# Patient Record
Sex: Female | Born: 1955
Health system: Southern US, Community
[De-identification: ages and names within clinical notes are randomized; demographics above are authoritative.]

## PROBLEM LIST (undated history)

## (undated) DIAGNOSIS — I10 Essential (primary) hypertension: Secondary | ICD-10-CM

## (undated) DIAGNOSIS — E785 Hyperlipidemia, unspecified: Secondary | ICD-10-CM

## (undated) DIAGNOSIS — B019 Varicella without complication: Secondary | ICD-10-CM

## (undated) DIAGNOSIS — IMO0002 Reserved for concepts with insufficient information to code with codable children: Secondary | ICD-10-CM

## (undated) HISTORY — DX: Hyperlipidemia, unspecified: E78.5

## (undated) HISTORY — DX: Essential (primary) hypertension: I10

## (undated) HISTORY — PX: BREAST EXCISIONAL BIOPSY: SUR124

## (undated) HISTORY — PX: ABDOMINAL HYSTERECTOMY: SHX81

## (undated) HISTORY — DX: Reserved for concepts with insufficient information to code with codable children: IMO0002

## (undated) HISTORY — DX: Varicella without complication: B01.9

## (undated) HISTORY — PX: BREAST BIOPSY: SHX20

---

## 2002-01-18 DIAGNOSIS — IMO0002 Reserved for concepts with insufficient information to code with codable children: Secondary | ICD-10-CM

## 2002-01-18 HISTORY — DX: Reserved for concepts with insufficient information to code with codable children: IMO0002

## 2004-03-27 ENCOUNTER — Ambulatory Visit: Payer: Self-pay | Admitting: Obstetrics and Gynecology

## 2004-03-31 ENCOUNTER — Ambulatory Visit: Payer: Self-pay | Admitting: Obstetrics and Gynecology

## 2004-10-01 ENCOUNTER — Ambulatory Visit: Payer: Self-pay | Admitting: Obstetrics and Gynecology

## 2005-03-29 ENCOUNTER — Ambulatory Visit: Payer: Self-pay | Admitting: Obstetrics and Gynecology

## 2006-06-10 ENCOUNTER — Ambulatory Visit: Payer: Self-pay

## 2007-07-19 ENCOUNTER — Ambulatory Visit: Payer: Self-pay | Admitting: Obstetrics and Gynecology

## 2007-07-26 ENCOUNTER — Ambulatory Visit: Payer: Self-pay

## 2007-07-31 ENCOUNTER — Ambulatory Visit: Payer: Self-pay | Admitting: Unknown Physician Specialty

## 2007-07-31 LAB — HM COLONOSCOPY

## 2007-08-02 ENCOUNTER — Ambulatory Visit: Payer: Self-pay

## 2007-08-29 ENCOUNTER — Ambulatory Visit: Payer: Self-pay | Admitting: Surgery

## 2007-09-27 ENCOUNTER — Ambulatory Visit: Payer: Self-pay | Admitting: Surgery

## 2007-10-04 ENCOUNTER — Ambulatory Visit: Payer: Self-pay | Admitting: Surgery

## 2007-11-02 ENCOUNTER — Ambulatory Visit: Payer: Self-pay | Admitting: Internal Medicine

## 2008-07-30 ENCOUNTER — Other Ambulatory Visit: Payer: Self-pay | Admitting: Internal Medicine

## 2008-08-07 ENCOUNTER — Ambulatory Visit: Payer: Self-pay

## 2008-08-21 ENCOUNTER — Ambulatory Visit: Payer: Self-pay

## 2008-10-31 ENCOUNTER — Other Ambulatory Visit: Payer: Self-pay | Admitting: Unknown Physician Specialty

## 2009-09-02 ENCOUNTER — Ambulatory Visit: Payer: Self-pay | Admitting: Internal Medicine

## 2010-02-17 ENCOUNTER — Ambulatory Visit: Payer: Self-pay | Admitting: Unknown Physician Specialty

## 2010-09-29 ENCOUNTER — Ambulatory Visit: Payer: Self-pay | Admitting: Internal Medicine

## 2010-12-14 ENCOUNTER — Ambulatory Visit: Payer: Self-pay | Admitting: Internal Medicine

## 2010-12-19 ENCOUNTER — Ambulatory Visit: Payer: Self-pay | Admitting: Internal Medicine

## 2011-09-30 ENCOUNTER — Ambulatory Visit: Payer: Self-pay | Admitting: Internal Medicine

## 2011-12-16 LAB — HM PAP SMEAR: HM Pap smear: NORMAL

## 2012-07-20 ENCOUNTER — Ambulatory Visit: Payer: Self-pay | Admitting: Internal Medicine

## 2012-07-22 LAB — BASIC METABOLIC PANEL: Potassium: 4.1 mmol/L (ref 3.4–5.3)

## 2012-07-25 ENCOUNTER — Encounter: Payer: Self-pay | Admitting: Internal Medicine

## 2012-07-25 ENCOUNTER — Ambulatory Visit (INDEPENDENT_AMBULATORY_CARE_PROVIDER_SITE_OTHER): Payer: 59 | Admitting: Internal Medicine

## 2012-07-25 VITALS — BP 122/82 | HR 64 | Temp 98.4°F | Resp 14 | Ht 66.0 in | Wt 165.2 lb

## 2012-07-25 DIAGNOSIS — I10 Essential (primary) hypertension: Secondary | ICD-10-CM

## 2012-07-25 DIAGNOSIS — E785 Hyperlipidemia, unspecified: Secondary | ICD-10-CM

## 2012-07-25 DIAGNOSIS — E663 Overweight: Secondary | ICD-10-CM | POA: Insufficient documentation

## 2012-07-25 DIAGNOSIS — E559 Vitamin D deficiency, unspecified: Secondary | ICD-10-CM

## 2012-07-25 DIAGNOSIS — Z6825 Body mass index (BMI) 25.0-25.9, adult: Secondary | ICD-10-CM

## 2012-07-25 DIAGNOSIS — R5381 Other malaise: Secondary | ICD-10-CM

## 2012-07-25 DIAGNOSIS — R5383 Other fatigue: Secondary | ICD-10-CM

## 2012-07-25 MED ORDER — LISINOPRIL 10 MG PO TABS: 10.0000 mg | ORAL_TABLET | Freq: Every day | ORAL | Status: DC

## 2012-07-25 NOTE — Patient Instructions (Addendum)
Please make an appt for fasting blood work  We will repeat your PAP 2 years after your last one.   This is  My version of a  "Low GI"  Diet:  It will  allow you to lose 4 to 8  lbs  per month without being hungry if you follow it carefully.  Your goal with exercise is a minimum of 30 minutes of aerobic exercise 5 days per week (Walking does not count once it becomes easy!)    All of the foods can be found at grocery stores and in bulk at [employer].  The Atkins protein bars and shakes are available in more varieties at Target, WalMart and Lowe's Foods.     7 AM Breakfast:  Choose from the following:  Low carbohydrate Protein  Shakes (I recommend the EAS AdvantEdge "Carb Control" shakes  Or the low carb shakes by Atkins.    2.5 carbs   Arnold's "Sandwhich Thin"toasted  w/ peanut butter (no jelly: about 20 net carbs  "Bagel Thin" with cream cheese and salmon: about 20 carbs   a scrambled egg/bacon/cheese burrito made with Mission's "carb balance" whole wheat tortilla  (about 10 net carbs )   Avoid cereal and bananas, oatmeal and cream of wheat and grits. They are loaded with carbohydrates!   10 AM: high protein snack  Protein bar by Atkins (the snack size, under 200 cal, usually < 6 net carbs).    A stick of cheese:  Around 1 carb,  100 cal     Dannon Light n Fit [place] Yogurt  (80 cal, 8 carbs)  Other so called "protein bars" and Greek yogurts tend to be loaded with carbohydrates.  Remember, in food advertising, the word "energy" is synonymous for " carbohydrate."  Lunch:   A Sandwich using the bread choices listed, Can use any  Eggs,  lunchmeat, grilled meat or canned tuna), avocado, regular mayo/mustard  and cheese.  A Salad using blue cheese, ranch,  Goddess or vinagrette,  No croutons or "confetti" and no "candied nuts" but regular nuts OK.   No pretzels or chips.  Pickles and miniature sweet peppers are a good low carb alternative that provide a "crunch"  The bread is the only source of  carbohydrate in a sandwich and  can be decreased by trying some of these alternatives to traditional loaf bread  Joseph's makes a pita bread and a flat bread that are 50 cal and 4 net carbs available at BJs and WalMart.  This can be toasted to use with hummous as well  Toufayan makes a low carb flatbread that's 100 cal and 9 net carbs available at [employer] and [employer] makes 2 sizes of  Low carb whole wheat tortilla  (The large one is 210 cal and 6 net carbs) Avoid "Low fat dressings, as well as Reyne Dumas and [address] dressings They are loaded with sugar!   3 PM/ Mid day  Snack:  Consider  1 ounce of  almonds, walnuts, pistachios, pecans, peanuts,  Macadamia nuts or a nut medley.  Avoid "granola"; the dried cranberries and raisins are loaded with carbohydrates. Mixed nuts as long as there are no raisins,  cranberries or dried fruit.     6 PM  Dinner:     Meat/fowl/fish with a green salad, and either broccoli, cauliflower, green beans, spinach, brussel sprouts or  Lima beans. DO NOT BREAD THE PROTEIN!!      There is a low carb  pasta by Dreamfield's that is acceptable and tastes great: only 5 digestible carbs/serving.( All grocery stores but BJs carry it )  Try Hurley Cisco Angelo's chicken piccata or chicken or eggplant parm over low carb pasta.(Lowes and BJs)   Marjory Lies Sanchez's "Carnitas" (pulled pork, no sauce,  0 carbs) or his beef pot roast to make a dinner burrito (at BJ's)  Pesto over low carb pasta (bj's sells a good quality pesto in the center refrigerated section of the deli   Whole wheat pasta is still full of digestible carbs and  Not as low in glycemic index as Dreamfield's.   Brown rice is still rice,  So skip the rice and noodles if you eat [place] or [place] (or at least limit to 1/2 cup)  9 PM snack :   Breyer's "low carb" fudgsicle or  ice cream bar (Carb Smart line), or  Weight Watcher's ice cream bar , or another "no sugar added" ice cream;  a serving of fresh  berries/cherries with whipped cream   Cheese or DANNON'S LlGHT N FIT GREEK YOGURT  Avoid bananas, pineapple, grapes  and watermelon on a regular basis because they are high in sugar.  THINK OF THEM AS DESSERT  Remember that snack Substitutions should be less than 10 NET carbs per serving and meals < 20 carbs. Remember to subtract fiber grams to get the "net carbs."

## 2012-07-25 NOTE — Progress Notes (Signed)
Patient ID: Karen Odom, female   DOB: 1955/06/09, 57 y.o.   MRN: [id number]  Patient Active Problem List   Diagnosis Date Noted  . Overweight (BMI 25.0-29.9) 07/25/2012  . Essential hypertension, benign 07/25/2012    Subjective:  CC:   Chief Complaint  Patient presents with  . Establish Care    HPI:   Karen Odom is a 57 y.o. female who presents as a new patient to establish primary care with the chief complaint of hypertension and overweight. . She is transferring from [employer].   Last PAP  By Arvil Chaco 10 months ago.  Despite a supracervical hysterectomy in  2003 due to large uterine fibroids and nonvisualization of ovaries. In the setting of menorrhagia,  Her follow up PAP was abnornmal,  HPV positive,  Had colposcopy and a Leep.  Has had 6 normal PAPS since then .  No FH of female CA.  No BRCA in family iether  Mammogram  Due , done at [facility]  3 biopsies all normal,  Last one late 50s.  Fibrocystic breast disease. Both breast.       Past Medical History  Diagnosis Date  . Chicken pox   . Hypertension   . Hyperlipidemia   . HPV test positive 2004    Past Surgical History  Procedure Laterality Date  . Breast biopsy Bilateral     2 x right breast 1 X left breast  . Abdominal hysterectomy      Family History  Problem Relation Age of Onset  . Alcohol abuse Father   . Heart disease Father   . Hyperlipidemia Father   . Hypertension Father   . Early death Father   . Diabetes Father     History   Social History  . Marital Status: Divorced    Spouse Name: N/A    Number of Children: N/A  . Years of Education: N/A   Occupational History  . Not on file.   Social History Main Topics  . Smoking status: Former Smoker -- 1.00 packs/day    Types: Cigarettes    Quit date: 07/26/1991  . Smokeless tobacco: Never Used  . Alcohol Use: Yes  . Drug Use: No  . Sexually Active: No   Other Topics Concern  . Not on file   Social History Narrative  . No  narrative on file   No Known Allergies   Review of Systems:  Patient denies headache, fevers, malaise, unintentional weight loss, skin rash, eye pain, sinus congestion and sinus pain, sore throat, dysphagia,  hemoptysis , cough, dyspnea, wheezing, chest pain, palpitations, orthopnea, edema, abdominal pain, nausea, melena, diarrhea, constipation, flank pain, dysuria, hematuria, urinary  Frequency, nocturia, numbness, tingling, seizures,  Focal weakness, Loss of consciousness,  Tremor, insomnia, depression, anxiety, and suicidal ideation.     Objective:  BP 122/82  Pulse 64  Temp(Src) 98.4 F (36.9 C) (Oral)  Resp 14  Ht 5\' 6"  (1.676 m)  Wt 165 lb 4 oz (74.957 kg)  BMI 26.68 kg/m2  SpO2 98%  General appearance: alert, cooperative and appears stated age Ears: normal TM's and external ear canals both ears Throat: lips, mucosa, and tongue normal; teeth and gums normal Neck: no adenopathy, no carotid bruit, supple, symmetrical, trachea midline and thyroid not enlarged, symmetric, no tenderness/mass/nodules Back: symmetric, no curvature. ROM normal. No CVA tenderness. Lungs: clear to auscultation bilaterally Heart: regular rate and rhythm, S1, S2 normal, no murmur, click, rub or gallop Abdomen: soft, non-tender; bowel sounds normal; no  masses,  no organomegaly Pulses: 2+ and symmetric Skin: Skin color, texture, turgor normal. No rashes or lesions Lymph nodes: Cervical, supraclavicular, and axillary nodes normal.  Assessment and Plan:  Overweight (BMI 25.0-29.9) I have addressed  BMI and recommended a low glycemic index diet utilizing smaller more frequent meals to increase metabolism.  I have also recommended that patient start exercising with a goal of 30 minutes of aerobic exercise a minimum of 5 days per week. Screening for lipid disorders, thyroid and diabetes to be done today.    Essential hypertension, benign Well controlled on current regimen. Renal function stable, no  changes today.   Updated Medication List Outpatient Encounter Prescriptions as of 07/25/2012  Medication Sig Dispense Refill  . lisinopril (PRINIVIL,ZESTRIL) 10 MG tablet Take 1 tablet (10 mg total) by mouth daily.  90 tablet  3  . [DISCONTINUED] lisinopril (PRINIVIL,ZESTRIL) 10 MG tablet Take 10 mg by mouth daily.       No facility-administered encounter medications on file as of 07/25/2012.     Orders Placed This Encounter  Procedures  . CBC with Differential  . Comprehensive metabolic panel  . TSH  . Lipid panel  . Vitamin D 25 hydroxy    No Follow-up on file.

## 2012-07-26 NOTE — Assessment & Plan Note (Signed)
Well controlled on current regimen. Renal function stable, no changes today. 

## 2012-07-26 NOTE — Assessment & Plan Note (Signed)
I have addressed  BMI and recommended a low glycemic index diet utilizing smaller more frequent meals to increase metabolism.  I have also recommended that patient start exercising with a goal of 30 minutes of aerobic exercise a minimum of 5 days per week. Screening for lipid disorders, thyroid and diabetes to be done today.   

## 2012-08-23 ENCOUNTER — Other Ambulatory Visit (INDEPENDENT_AMBULATORY_CARE_PROVIDER_SITE_OTHER): Payer: 59

## 2012-08-23 ENCOUNTER — Telehealth: Payer: Self-pay | Admitting: *Deleted

## 2012-08-23 DIAGNOSIS — E785 Hyperlipidemia, unspecified: Secondary | ICD-10-CM

## 2012-08-23 DIAGNOSIS — R5383 Other fatigue: Secondary | ICD-10-CM

## 2012-08-23 DIAGNOSIS — E559 Vitamin D deficiency, unspecified: Secondary | ICD-10-CM

## 2012-08-23 DIAGNOSIS — R5381 Other malaise: Secondary | ICD-10-CM

## 2012-08-23 DIAGNOSIS — R748 Abnormal levels of other serum enzymes: Secondary | ICD-10-CM

## 2012-08-23 LAB — CBC WITH DIFFERENTIAL/PLATELET
Basophils Relative: 0.5 % (ref 0.0–3.0)
Eosinophils Relative: 3.4 % (ref 0.0–5.0)
HCT: 41.5 % (ref 36.0–46.0)
Hemoglobin: 13.9 g/dL (ref 12.0–15.0)
Lymphs Abs: 1.8 10*3/uL (ref 0.7–4.0)
MCV: 96.2 fl (ref 78.0–100.0)
Monocytes Absolute: 0.4 10*3/uL (ref 0.1–1.0)
RBC: 4.31 Mil/uL (ref 3.87–5.11)
WBC: 4 10*3/uL — ABNORMAL LOW (ref 4.5–10.5)

## 2012-08-23 LAB — COMPREHENSIVE METABOLIC PANEL
Albumin: 4.1 g/dL (ref 3.5–5.2)
Alkaline Phosphatase: 46 U/L (ref 39–117)
BUN: 13 mg/dL (ref 6–23)
CO2: 31 mEq/L (ref 19–32)
GFR: 81.11 mL/min (ref >60.00)
Glucose, Bld: 100 mg/dL — ABNORMAL HIGH (ref 70–99)
Potassium: 5.1 mEq/L (ref 3.5–5.1)
Total Bilirubin: 0.3 mg/dL (ref 0.3–1.2)

## 2012-08-23 LAB — LIPID PANEL
Cholesterol: 226 mg/dL — ABNORMAL HIGH (ref 0–200)
Total CHOL/HDL Ratio: 2
Triglycerides: 67 mg/dL (ref 0.0–149.0)

## 2012-08-23 NOTE — Telephone Encounter (Signed)
Pt came in for labs today, request if a TSH was in her labs today to please not get them she just had one done on 07.05.2014, pt gave me results and i gave them to [place]  Just an [employer]

## 2012-08-24 ENCOUNTER — Other Ambulatory Visit: Payer: Self-pay | Admitting: Internal Medicine

## 2012-08-24 ENCOUNTER — Telehealth: Payer: Self-pay | Admitting: Internal Medicine

## 2012-08-24 NOTE — Progress Notes (Signed)
Quick Note:  Your liver enzyme is mildly elevated for unclear reasons. I recommend that you return In a month for repeat hepatic panel and if still elevated we'll do additional blood tests to rule out various infections and autoimmune disorders that can elevate liver enzymes (hepatitis, hemochromatosis, etc) and have an ultrasound of the liver to examine its size and general appearance.  ______

## 2012-08-24 NOTE — Telephone Encounter (Signed)
Left voicemail for patient to return call.

## 2012-08-24 NOTE — Addendum Note (Signed)
Addended by: Sherlene Shams on: 08/24/2012 11:36 PM   Modules accepted: Orders

## 2012-08-24 NOTE — Telephone Encounter (Signed)
The abbreviated labs fron [facility] are normal and are fine for monitoring use of lisinopril she should have fasting labs done here prior to next 6 month follow up

## 2012-09-08 NOTE — Telephone Encounter (Signed)
Patient notified and has lab appointment.

## 2012-10-04 ENCOUNTER — Other Ambulatory Visit (INDEPENDENT_AMBULATORY_CARE_PROVIDER_SITE_OTHER): Payer: 59

## 2012-10-04 ENCOUNTER — Encounter: Payer: Self-pay | Admitting: Internal Medicine

## 2012-10-04 ENCOUNTER — Ambulatory Visit: Payer: Self-pay | Admitting: Internal Medicine

## 2012-10-04 DIAGNOSIS — R748 Abnormal levels of other serum enzymes: Secondary | ICD-10-CM

## 2012-10-04 LAB — HM MAMMOGRAPHY: HM Mammogram: NORMAL

## 2012-10-04 LAB — HEPATIC FUNCTION PANEL
ALT: 21 U/L (ref 0–35)
Albumin: 4.1 g/dL (ref 3.5–5.2)
Bilirubin, Direct: 0.1 mg/dL (ref 0.0–0.3)
Total Protein: 6.8 g/dL (ref 6.0–8.3)

## 2012-10-05 ENCOUNTER — Encounter: Payer: Self-pay | Admitting: Internal Medicine

## 2012-10-20 ENCOUNTER — Encounter: Payer: Self-pay | Admitting: Internal Medicine

## 2012-11-14 ENCOUNTER — Encounter: Payer: Self-pay | Admitting: Internal Medicine

## 2012-11-15 NOTE — Telephone Encounter (Signed)
Amber please initiate referral to [facility] for this patient.  There is no EPIc order available Reason:  Hypertension ,  Overweight

## 2013-01-02 ENCOUNTER — Ambulatory Visit: Payer: Self-pay | Admitting: Internal Medicine

## 2013-01-18 ENCOUNTER — Ambulatory Visit: Payer: Self-pay | Admitting: Internal Medicine

## 2013-02-07 ENCOUNTER — Encounter: Payer: Self-pay | Admitting: Internal Medicine

## 2013-02-07 DIAGNOSIS — M8580 Other specified disorders of bone density and structure, unspecified site: Secondary | ICD-10-CM

## 2013-02-07 NOTE — Telephone Encounter (Signed)
Amber per [employer] request,  Send rx to [place],  Patient will set up ,  thanks

## 2013-02-27 ENCOUNTER — Ambulatory Visit: Payer: Self-pay | Admitting: Internal Medicine

## 2013-03-09 ENCOUNTER — Ambulatory Visit: Payer: Self-pay | Admitting: Internal Medicine

## 2013-03-12 ENCOUNTER — Telehealth: Payer: Self-pay | Admitting: Internal Medicine

## 2013-03-12 DIAGNOSIS — M8580 Other specified disorders of bone density and structure, unspecified site: Secondary | ICD-10-CM | POA: Insufficient documentation

## 2013-03-12 DIAGNOSIS — M810 Age-related osteoporosis without current pathological fracture: Secondary | ICD-10-CM | POA: Insufficient documentation

## 2013-03-12 NOTE — Telephone Encounter (Signed)
Left detailed message on home voicemail per DPR.

## 2013-03-12 NOTE — Assessment & Plan Note (Signed)
Her T scores have progressively worsened by repeat DEXA.  We will discuss therapy at her annual visit.  Needs to be getting 1000 units Vit d and 1200 mg calcium  Daily through diet and supplements and doing weight bearing exeercise (walking) daily for 30 minutes   

## 2013-03-12 NOTE — Telephone Encounter (Signed)
Her T scores have progressively worsened by repeat DEXA.  We will discuss therapy at her annual visit.  Needs to be getting 1000 units Vit d and 1200 mg calcium  Daily through diet and supplements and doing weight bearing exeercise (walking) daily for 30 minutes

## 2013-03-18 ENCOUNTER — Ambulatory Visit: Payer: Self-pay | Admitting: Internal Medicine

## 2013-04-04 ENCOUNTER — Encounter: Payer: Self-pay | Admitting: Internal Medicine

## 2013-04-15 ENCOUNTER — Encounter: Payer: Self-pay | Admitting: Internal Medicine

## 2013-04-16 MED ORDER — CALCIUM CARBONATE-VITAMIN D 500-200 MG-UNIT PO TABS: 1.0000 | ORAL_TABLET | Freq: Two times a day (BID) | ORAL | Status: DC

## 2013-04-16 NOTE — Telephone Encounter (Signed)
Karen Odom Please  MAIL RX FOR CALCIUM TO PATIENT

## 2013-04-18 NOTE — Telephone Encounter (Signed)
Karen Odom  \ Can you print out the letter I wrote for her insurance to pay for the calcium/vitamin D supplementation?  ThanksT

## 2013-05-17 ENCOUNTER — Telehealth: Payer: Self-pay | Admitting: Internal Medicine

## 2013-05-17 NOTE — Telephone Encounter (Signed)
Pr dropped off stanely benefit forms to be filled out In box

## 2013-05-17 NOTE — Telephone Encounter (Signed)
Placed in Dr. Lowella Dell folder for 05/18/13

## 2013-05-18 DIAGNOSIS — Z0279 Encounter for issue of other medical certificate: Secondary | ICD-10-CM

## 2013-08-14 ENCOUNTER — Encounter: Payer: 59 | Admitting: Internal Medicine

## 2013-08-16 ENCOUNTER — Other Ambulatory Visit (HOSPITAL_COMMUNITY)
Admission: RE | Admit: 2013-08-16 | Discharge: 2013-08-16 | Disposition: A | Discharge status: Home / Self Care | Payer: 59 | Source: Ambulatory Visit | Attending: Internal Medicine | Admitting: Internal Medicine

## 2013-08-16 ENCOUNTER — Encounter: Payer: Self-pay | Admitting: Internal Medicine

## 2013-08-16 ENCOUNTER — Ambulatory Visit (INDEPENDENT_AMBULATORY_CARE_PROVIDER_SITE_OTHER): Payer: 59 | Admitting: Internal Medicine

## 2013-08-16 VITALS — BP 128/78 | HR 59 | Temp 98.3°F | Resp 18 | Ht 66.25 in | Wt 135.5 lb

## 2013-08-16 DIAGNOSIS — E663 Overweight: Secondary | ICD-10-CM

## 2013-08-16 DIAGNOSIS — Z1239 Encounter for other screening for malignant neoplasm of breast: Secondary | ICD-10-CM

## 2013-08-16 DIAGNOSIS — M8580 Other specified disorders of bone density and structure, unspecified site: Secondary | ICD-10-CM

## 2013-08-16 DIAGNOSIS — R5383 Other fatigue: Secondary | ICD-10-CM

## 2013-08-16 DIAGNOSIS — M899 Disorder of bone, unspecified: Secondary | ICD-10-CM

## 2013-08-16 DIAGNOSIS — Z1151 Encounter for screening for human papillomavirus (HPV): Secondary | ICD-10-CM | POA: Insufficient documentation

## 2013-08-16 DIAGNOSIS — R5381 Other malaise: Secondary | ICD-10-CM

## 2013-08-16 DIAGNOSIS — Z0000 Encounter for general adult medical examination without abnormal findings: Secondary | ICD-10-CM

## 2013-08-16 DIAGNOSIS — M949 Disorder of cartilage, unspecified: Secondary | ICD-10-CM

## 2013-08-16 DIAGNOSIS — Z01419 Encounter for gynecological examination (general) (routine) without abnormal findings: Secondary | ICD-10-CM | POA: Insufficient documentation

## 2013-08-16 DIAGNOSIS — I10 Essential (primary) hypertension: Secondary | ICD-10-CM

## 2013-08-16 DIAGNOSIS — Z124 Encounter for screening for malignant neoplasm of cervix: Secondary | ICD-10-CM

## 2013-08-16 DIAGNOSIS — E785 Hyperlipidemia, unspecified: Secondary | ICD-10-CM

## 2013-08-16 LAB — COMPREHENSIVE METABOLIC PANEL
ALT: 24 U/L (ref 0–35)
AST: 29 U/L (ref 0–37)
Albumin: 4.4 g/dL (ref 3.5–5.2)
Alkaline Phosphatase: 44 U/L (ref 39–117)
BUN: 17 mg/dL (ref 6–23)
CALCIUM: 9.7 mg/dL (ref 8.4–10.5)
CHLORIDE: 101 meq/L (ref 96–112)
CO2: 26 mEq/L (ref 19–32)
Creatinine, Ser: 0.8 mg/dL (ref 0.4–1.2)
GFR: 83.29 mL/min (ref >60.00)
Glucose, Bld: 87 mg/dL (ref 70–99)
Potassium: 4.6 mEq/L (ref 3.5–5.1)
Sodium: 137 mEq/L (ref 135–145)
Total Bilirubin: 0.8 mg/dL (ref 0.2–1.2)
Total Protein: 6.9 g/dL (ref 6.0–8.3)

## 2013-08-16 LAB — LIPID PANEL
CHOL/HDL RATIO: 2
Cholesterol: 250 mg/dL — ABNORMAL HIGH (ref 0–200)
HDL: 125.8 mg/dL (ref >39.00)
LDL CALC: 118 mg/dL — AB (ref 0–99)
NONHDL: 124.2
Triglycerides: 31 mg/dL (ref 0.0–149.0)
VLDL: 6.2 mg/dL (ref 0.0–40.0)

## 2013-08-16 LAB — TSH: TSH: 1.69 u[IU]/mL (ref 0.35–4.50)

## 2013-08-16 LAB — CBC WITH DIFFERENTIAL/PLATELET
Basophils Absolute: 0 10*3/uL (ref 0.0–0.1)
Basophils Relative: 0.7 % (ref 0.0–3.0)
EOS PCT: 4.5 % (ref 0.0–5.0)
Eosinophils Absolute: 0.2 10*3/uL (ref 0.0–0.7)
HEMATOCRIT: 41.2 % (ref 36.0–46.0)
Hemoglobin: 13.7 g/dL (ref 12.0–15.0)
LYMPHS ABS: 2 10*3/uL (ref 0.7–4.0)
Lymphocytes Relative: 41.3 % (ref 12.0–46.0)
MCHC: 33.2 g/dL (ref 30.0–36.0)
MCV: 97.3 fl (ref 78.0–100.0)
MONO ABS: 0.6 10*3/uL (ref 0.1–1.0)
Monocytes Relative: 12.9 % — ABNORMAL HIGH (ref 3.0–12.0)
NEUTROS ABS: 2 10*3/uL (ref 1.4–7.7)
Neutrophils Relative %: 40.6 % — ABNORMAL LOW (ref 43.0–77.0)
PLATELETS: 417 10*3/uL — AB (ref 150.0–400.0)
RBC: 4.24 Mil/uL (ref 3.87–5.11)
RDW: 13.7 % (ref 11.5–15.5)
WBC: 4.9 10*3/uL (ref 4.0–10.5)

## 2013-08-16 LAB — VITAMIN D 25 HYDROXY (VIT D DEFICIENCY, FRACTURES): VITD: 64.82 ng/mL (ref 30.00–100.00)

## 2013-08-16 NOTE — Progress Notes (Signed)
Patient ID: Karen Odom, female   DOB: 1955/03/05, 58 y.o.   MRN: [id number]     Subjective:     Karen Odom is a 58 y.o. female and is here for a comprehensive physical exam. The patient reports no problems.  History   Social History  . Marital Status: Divorced    Spouse Name: N/A    Number of Children: N/A  . Years of Education: N/A   Occupational History  . Not on file.   Social History Main Topics  . Smoking status: Former Smoker -- 1.00 packs/day    Types: Cigarettes    Quit date: 07/26/1991  . Smokeless tobacco: Never Used  . Alcohol Use: Yes  . Drug Use: No  . Sexual Activity: No   Other Topics Concern  . Not on file   Social History Narrative  . No narrative on file   Health Maintenance  Topic Date Due  . Influenza Vaccine  08/18/2013  . Mammogram  10/05/2014  . Tetanus/tdap  05/06/2015  . Pap Smear  08/16/2016  . Colonoscopy  07/30/2017    The following portions of the patient's history were reviewed and updated as appropriate: allergies, current medications, past family history, past medical history, past social history, past surgical history and problem list.  Review of Systems A comprehensive review of systems was negative.   Objective:   BP 128/78  Pulse 59  Temp(Src) 98.3 F (36.8 C) (Oral)  Resp 18  Ht 5' 6.25" (1.683 m)  Wt 135 lb 8 oz (61.462 kg)  BMI 21.70 kg/m2  SpO2 99%  General Appearance:    Alert, cooperative, no distress, appears stated age  Head:    Normocephalic, without obvious abnormality, atraumatic  Eyes:    PERRL, conjunctiva/corneas clear, EOM's intact, fundi    benign, both eyes  Ears:    Normal TM's and external ear canals, both ears  Nose:   Nares normal, septum midline, mucosa normal, no drainage    or sinus tenderness  Throat:   Lips, mucosa, and tongue normal; teeth and gums normal  Neck:   Supple, symmetrical, trachea midline, no adenopathy;    thyroid:  no enlargement/tenderness/nodules; no carotid  bruit or JVD  Back:     Symmetric, no curvature, ROM normal, no CVA tenderness  Lungs:     Clear to auscultation bilaterally, respirations unlabored  Chest Wall:    No tenderness or deformity   Heart:    Regular rate and rhythm, S1 and S2 normal, no murmur, rub   or gallop  Breast Exam:    No tenderness, masses, or nipple abnormality  Abdomen:     Soft, non-tender, bowel sounds active all four quadrants,    no masses, no organomegaly  Genitalia:    Pelvic: cervix normal in appearance, external genitalia normal, no adnexal masses or tenderness, no cervical motion tenderness, rectovaginal septum normal, uterus normal size, shape, and consistency and vagina normal without discharge  Extremities:   Extremities normal, atraumatic, no cyanosis or edema  Pulses:   2+ and symmetric all extremities  Skin:   Skin color, texture, turgor normal, no rashes or lesions  Lymph nodes:   Cervical, supraclavicular, and axillary nodes normal  Neurologic:   CNII-XII intact, normal strength, sensation and reflexes    throughout     .    Assessment and Plan:   Overweight (BMI 25.0-29.9) I have congratulated her in reduction of   BMI to 21 with the loss of 30 lbs  and encouraged  Continued low glycemic index diet and regular exercise a minimum of 5 days per week. To maintain weight.  I  Screening for cervical cancer She is S/p supacervical hysterectomy.  PAP done today due to history of abnormal PAP with HPV positive s/o LEEP  ;  Exam normal   Visit for preventive health examination Annual comprehensive exam was done including breast, pelvic and PAP smear. All screenings have been addressed .    Updated Medication List Outpatient Encounter Prescriptions as of 08/16/2013  Medication Sig  . calcium-vitamin D (OSCAL WITH D) 500-200 MG-UNIT per tablet Take 1 tablet by mouth 2 (two) times daily.  Marland Kitchen lisinopril (PRINIVIL,ZESTRIL) 10 MG tablet Take 1 tablet (10 mg total) by mouth daily.

## 2013-08-16 NOTE — Assessment & Plan Note (Addendum)
I have congratulated her in reduction of   BMI to 21 with the loss of 30 lbs and encouraged  Continued low glycemic index diet and regular exercise a minimum of 5 days per week. To maintain weight.  I

## 2013-08-16 NOTE — Progress Notes (Signed)
Pre-visit discussion using our clinic review tool. No additional management support is needed unless otherwise documented below in the visit note.  

## 2013-08-16 NOTE — Patient Instructions (Signed)
You look amazing!   I recommend getting the majority of your calcium and Vitamin D  through diet rather than supplements given the recent association of calcium supplements with increased coronary artery calcium scores (You need 1200 mg daily )   Unsweetened almond/coconut milk is a great low calorie low carb, cholesterol free  way to increase your dietary calcium and vitamin D.  Try the blue Jackquline Bosch  Try the WASA crackers and the Quest bars as convenient low snacks  Try toasting the Joseph's pita bread

## 2013-08-17 ENCOUNTER — Encounter: Payer: Self-pay | Admitting: Internal Medicine

## 2013-08-17 LAB — CYTOLOGY - PAP

## 2013-08-19 ENCOUNTER — Encounter: Payer: Self-pay | Admitting: Internal Medicine

## 2013-08-19 DIAGNOSIS — Z0000 Encounter for general adult medical examination without abnormal findings: Secondary | ICD-10-CM | POA: Insufficient documentation

## 2013-08-19 DIAGNOSIS — Z124 Encounter for screening for malignant neoplasm of cervix: Secondary | ICD-10-CM | POA: Insufficient documentation

## 2013-08-19 NOTE — Assessment & Plan Note (Signed)
She is S/p supacervical hysterectomy.  PAP done today due to history of abnormal PAP with HPV positive s/o LEEP  ;  Exam normal

## 2013-08-19 NOTE — Assessment & Plan Note (Signed)
Annual comprehensive exam was done including breast, pelvic and PAP smear. All screenings have been addressed .  

## 2013-10-29 ENCOUNTER — Encounter: Payer: Self-pay | Admitting: Internal Medicine

## 2013-10-31 MED ORDER — LISINOPRIL 10 MG PO TABS: 10.0000 mg | ORAL_TABLET | Freq: Every day | ORAL | Status: DC

## 2013-11-28 ENCOUNTER — Ambulatory Visit: Payer: Self-pay | Admitting: Internal Medicine

## 2013-11-28 ENCOUNTER — Encounter: Payer: Self-pay | Admitting: *Deleted

## 2013-11-28 LAB — HM MAMMOGRAPHY: HM MAMMO: NEGATIVE

## 2013-12-18 ENCOUNTER — Encounter: Payer: Self-pay | Admitting: Internal Medicine

## 2014-01-24 ENCOUNTER — Encounter: Payer: Self-pay | Admitting: *Deleted

## 2014-01-24 NOTE — Telephone Encounter (Signed)
From: Alveria Apley Sent: 10/08/2013 5:58 AM EDT To: Aviva Kluver Subject: RE:Flu Shot Clinic Saturday October 13, 2013  I have already received FLU SHOT as an employee of [facility].  Thank You, Norlene Campbell  ----- Message ----- From: Genia Plants Sent: 10/06/2013 6:37 PM EDT To: Alveria Apley Subject: Flu Shot Clinic Saturday October 13, 2013  [facility] will hold a [facility] on Saturday, September 26 from 9 am to noon. In order to plan appropriately, we ask you to please call the office to schedule an appointment time during the clinic. Our schedulers are ready to assist you, Monday through Friday, 8 am to 5 pm at [phone]. Thank you for choosing [facility] for your healthcare needs.

## 2014-03-12 ENCOUNTER — Encounter: Payer: Self-pay | Admitting: Internal Medicine

## 2014-03-12 DIAGNOSIS — Z13820 Encounter for screening for osteoporosis: Secondary | ICD-10-CM

## 2014-03-12 NOTE — Telephone Encounter (Signed)
Please advise 

## 2014-04-09 ENCOUNTER — Ambulatory Visit: Payer: Self-pay | Admitting: Internal Medicine

## 2014-04-16 ENCOUNTER — Telehealth: Payer: Self-pay | Admitting: Internal Medicine

## 2014-04-16 ENCOUNTER — Encounter: Payer: Self-pay | Admitting: *Deleted

## 2014-04-16 DIAGNOSIS — M8580 Other specified disorders of bone density and structure, unspecified site: Secondary | ICD-10-CM

## 2014-04-16 NOTE — Telephone Encounter (Signed)
Letter mailed

## 2014-04-16 NOTE — Telephone Encounter (Signed)
You have moderate osteopenia by recent DEXA , Your , T Score was  -1.9 , so  Your  risk of fracture in the next  10 yrs is about 8% .  I recommend weight bearing exercise, 1200 mg calcium through diet /supplements and 1000 units Vit D daily  And a Repeat DEXA in 2 years .  I recommend getting the majority of your calcium and Vitamin D  through diet rather than supplements given the recent association of calcium supplements with increased coronary artery calcium scores (You need 1200 mg daily )    almond milk is a great low calorie low carb, cholesterol free  way to increase your dietary calcium and vitamin D.  Try the Silk brand

## 2014-08-22 ENCOUNTER — Ambulatory Visit (INDEPENDENT_AMBULATORY_CARE_PROVIDER_SITE_OTHER): Payer: 59 | Admitting: Internal Medicine

## 2014-08-22 ENCOUNTER — Encounter: Payer: Self-pay | Admitting: Internal Medicine

## 2014-08-22 VITALS — BP 124/78 | HR 61 | Temp 98.5°F | Resp 14 | Ht 66.75 in | Wt 128.0 lb

## 2014-08-22 DIAGNOSIS — Z0000 Encounter for general adult medical examination without abnormal findings: Secondary | ICD-10-CM | POA: Diagnosis not present

## 2014-08-22 DIAGNOSIS — E875 Hyperkalemia: Secondary | ICD-10-CM

## 2014-08-22 DIAGNOSIS — E785 Hyperlipidemia, unspecified: Secondary | ICD-10-CM | POA: Diagnosis not present

## 2014-08-22 DIAGNOSIS — R5383 Other fatigue: Secondary | ICD-10-CM

## 2014-08-22 DIAGNOSIS — R634 Abnormal weight loss: Secondary | ICD-10-CM

## 2014-08-22 DIAGNOSIS — I10 Essential (primary) hypertension: Secondary | ICD-10-CM | POA: Diagnosis not present

## 2014-08-22 DIAGNOSIS — Z1239 Encounter for other screening for malignant neoplasm of breast: Secondary | ICD-10-CM

## 2014-08-22 DIAGNOSIS — M8580 Other specified disorders of bone density and structure, unspecified site: Secondary | ICD-10-CM

## 2014-08-22 DIAGNOSIS — Z113 Encounter for screening for infections with a predominantly sexual mode of transmission: Secondary | ICD-10-CM | POA: Diagnosis not present

## 2014-08-22 LAB — CBC WITH DIFFERENTIAL/PLATELET
BASOS PCT: 0.8 % (ref 0.0–3.0)
Basophils Absolute: 0 10*3/uL (ref 0.0–0.1)
EOS ABS: 0.2 10*3/uL (ref 0.0–0.7)
Eosinophils Relative: 3.1 % (ref 0.0–5.0)
HCT: 41.3 % (ref 36.0–46.0)
HEMOGLOBIN: 13.8 g/dL (ref 12.0–15.0)
Lymphocytes Relative: 26.3 % (ref 12.0–46.0)
Lymphs Abs: 1.6 10*3/uL (ref 0.7–4.0)
MCHC: 33.5 g/dL (ref 30.0–36.0)
MCV: 99.7 fl (ref 78.0–100.0)
Monocytes Absolute: 0.6 10*3/uL (ref 0.1–1.0)
Monocytes Relative: 9.5 % (ref 3.0–12.0)
NEUTROS ABS: 3.6 10*3/uL (ref 1.4–7.7)
Neutrophils Relative %: 60.3 % (ref 43.0–77.0)
Platelets: 408 10*3/uL — ABNORMAL HIGH (ref 150.0–400.0)
RBC: 4.14 Mil/uL (ref 3.87–5.11)
RDW: 14.4 % (ref 11.5–15.5)
WBC: 5.9 10*3/uL (ref 4.0–10.5)

## 2014-08-22 LAB — TSH: TSH: 1.17 u[IU]/mL (ref 0.35–4.50)

## 2014-08-22 LAB — LIPID PANEL
CHOLESTEROL: 260 mg/dL — AB (ref 0–200)
HDL: 118.4 mg/dL (ref >39.00)
LDL Cholesterol: 130 mg/dL — ABNORMAL HIGH (ref 0–99)
NonHDL: 141.33
Total CHOL/HDL Ratio: 2
Triglycerides: 59 mg/dL (ref 0.0–149.0)
VLDL: 11.8 mg/dL (ref 0.0–40.0)

## 2014-08-22 LAB — COMPREHENSIVE METABOLIC PANEL
ALT: 19 U/L (ref 0–35)
AST: 21 U/L (ref 0–37)
Albumin: 4.4 g/dL (ref 3.5–5.2)
Alkaline Phosphatase: 39 U/L (ref 39–117)
BILIRUBIN TOTAL: 0.4 mg/dL (ref 0.2–1.2)
BUN: 22 mg/dL (ref 6–23)
CALCIUM: 9.9 mg/dL (ref 8.4–10.5)
CO2: 30 mEq/L (ref 19–32)
Chloride: 105 mEq/L (ref 96–112)
Creatinine, Ser: 0.78 mg/dL (ref 0.40–1.20)
GFR: 80.54 mL/min (ref >60.00)
GLUCOSE: 93 mg/dL (ref 70–99)
POTASSIUM: 5.6 meq/L — AB (ref 3.5–5.1)
SODIUM: 141 meq/L (ref 135–145)
Total Protein: 6.7 g/dL (ref 6.0–8.3)

## 2014-08-22 NOTE — Patient Instructions (Signed)
Mammogram due in November We will repeat your DEXA and PAP in 2017  Add upper body weight to your regular exercise to help strengthen your spine  Health Maintenance Adopting a healthy lifestyle and getting preventive care can go a long way to promote health and wellness. Talk with your health care provider about what schedule of regular examinations is right for you. This is a good chance for you to check in with your provider about disease prevention and staying healthy. In between checkups, there are plenty of things you can do on your own. Experts have done a lot of research about which lifestyle changes and preventive measures are most likely to keep you healthy. Ask your health care provider for more information. WEIGHT AND DIET  Eat a healthy diet  Be sure to include plenty of vegetables, fruits, low-fat dairy products, and lean protein.  Do not eat a lot of foods high in solid fats, added sugars, or salt.  Get regular exercise. This is one of the most important things you can do for your health.  Most adults should exercise for at least 150 minutes each week. The exercise should increase your heart rate and make you sweat (moderate-intensity exercise).  Most adults should also do strengthening exercises at least twice a week. This is in addition to the moderate-intensity exercise.  Maintain a healthy weight  Body mass index (BMI) is a measurement that can be used to identify possible weight problems. It estimates body fat based on height and weight. Your health care provider can help determine your BMI and help you achieve or maintain a healthy weight.  For females 58 years of age and older:   A BMI below 18.5 is considered underweight.  A BMI of 18.5 to 24.9 is normal.  A BMI of 25 to 29.9 is considered overweight.  A BMI of 30 and above is considered obese.  Watch levels of cholesterol and blood lipids  You should start having your blood tested for lipids and  cholesterol at 58 years of age, then have this test every 5 years.  You may need to have your cholesterol levels checked more often if:  Your lipid or cholesterol levels are high.  You are older than 58 years of age.  You are at high risk for heart disease.  CANCER SCREENING   Lung Cancer  Lung cancer screening is recommended for adults 41-23 years old who are at high risk for lung cancer because of a history of smoking.  A yearly low-dose CT scan of the lungs is recommended for people who:  Currently smoke.  Have quit within the past 15 years.  Have at least a 30-pack-year history of smoking. A pack year is smoking an average of one pack of cigarettes a day for 1 year.  Yearly screening should continue until it has been 15 years since you quit.  Yearly screening should stop if you develop a health problem that would prevent you from having lung cancer treatment.  Breast Cancer  Practice breast self-awareness. This means understanding how your breasts normally appear and feel.  It also means doing regular breast self-exams. Let your health care provider know about any changes, no matter how small.  If you are in your 20s or 30s, you should have a clinical breast exam (CBE) by a health care provider every 1-3 years as part of a regular health exam.  If you are 45 or older, have a CBE every year. Also consider having a  breast X-ray (mammogram) every year.  If you have a family history of breast cancer, talk to your health care provider about genetic screening.  If you are at high risk for breast cancer, talk to your health care provider about having an MRI and a mammogram every year.  Breast cancer gene (BRCA) assessment is recommended for women who have family members with BRCA-related cancers. BRCA-related cancers include:  Breast.  Ovarian.  Tubal.  Peritoneal cancers.  Results of the assessment will determine the need for genetic counseling and BRCA1 and BRCA2  testing. Cervical Cancer Routine pelvic examinations to screen for cervical cancer are no longer recommended for nonpregnant women who are considered low risk for cancer of the pelvic organs (ovaries, uterus, and vagina) and who do not have symptoms. A pelvic examination may be necessary if you have symptoms including those associated with pelvic infections. Ask your health care provider if a screening pelvic exam is right for you.   The Pap test is the screening test for cervical cancer for women who are considered at risk.  If you had a hysterectomy for a problem that was not cancer or a condition that could lead to cancer, then you no longer need Pap tests.  If you are older than 65 years, and you have had normal Pap tests for the past 10 years, you no longer need to have Pap tests.  If you have had past treatment for cervical cancer or a condition that could lead to cancer, you need Pap tests and screening for cancer for at least 20 years after your treatment.  If you no longer get a Pap test, assess your risk factors if they change (such as having a new sexual partner). This can affect whether you should start being screened again.  Some women have medical problems that increase their chance of getting cervical cancer. If this is the case for you, your health care provider may recommend more frequent screening and Pap tests.  The human papillomavirus (HPV) test is another test that may be used for cervical cancer screening. The HPV test looks for the virus that can cause cell changes in the cervix. The cells collected during the Pap test can be tested for HPV.  The HPV test can be used to screen women 42 years of age and older. Getting tested for HPV can extend the interval between normal Pap tests from three to five years.  An HPV test also should be used to screen women of any age who have unclear Pap test results.  After 58 years of age, women should have HPV testing as often as Pap  tests.  Colorectal Cancer  This type of cancer can be detected and often prevented.  Routine colorectal cancer screening usually begins at 58 years of age and continues through 58 years of age.  Your health care provider may recommend screening at an earlier age if you have risk factors for colon cancer.  Your health care provider may also recommend using home test kits to check for hidden blood in the stool.  A small camera at the end of a tube can be used to examine your colon directly (sigmoidoscopy or colonoscopy). This is done to check for the earliest forms of colorectal cancer.  Routine screening usually begins at age 64.  Direct examination of the colon should be repeated every 5-10 years through 58 years of age. However, you may need to be screened more often if early forms of precancerous polyps or  small growths are found. Skin Cancer  Check your skin from head to toe regularly.  Tell your health care provider about any new moles or changes in moles, especially if there is a change in a mole's shape or color.  Also tell your health care provider if you have a mole that is larger than the size of a pencil eraser.  Always use sunscreen. Apply sunscreen liberally and repeatedly throughout the day.  Protect yourself by wearing long sleeves, pants, a wide-brimmed hat, and sunglasses whenever you are outside. HEART DISEASE, DIABETES, AND HIGH BLOOD PRESSURE   Have your blood pressure checked at least every 1-2 years. High blood pressure causes heart disease and increases the risk of stroke.  If you are between 27 years and 83 years old, ask your health care provider if you should take aspirin to prevent strokes.  Have regular diabetes screenings. This involves taking a blood sample to check your fasting blood sugar level.  If you are at a normal weight and have a low risk for diabetes, have this test once every three years after 58 years of age.  If you are overweight and  have a high risk for diabetes, consider being tested at a younger age or more often. PREVENTING INFECTION  Hepatitis B  If you have a higher risk for hepatitis B, you should be screened for this virus. You are considered at high risk for hepatitis B if:  You were born in a country where hepatitis B is common. Ask your health care provider which countries are considered high risk.  Your parents were born in a high-risk country, and you have not been immunized against hepatitis B (hepatitis B vaccine).  You have HIV or AIDS.  You use needles to inject street drugs.  You live with someone who has hepatitis B.  You have had sex with someone who has hepatitis B.  You get hemodialysis treatment.  You take certain medicines for conditions, including cancer, organ transplantation, and autoimmune conditions. Hepatitis C  Blood testing is recommended for:  Everyone born from [birth year] through 1964.  Anyone with known risk factors for hepatitis C. Sexually transmitted infections (STIs)  You should be screened for sexually transmitted infections (STIs) including gonorrhea and chlamydia if:  You are sexually active and are younger than 58 years of age.  You are older than 58 years of age and your health care provider tells you that you are at risk for this type of infection.  Your sexual activity has changed since you were last screened and you are at an increased risk for chlamydia or gonorrhea. Ask your health care provider if you are at risk.  If you do not have HIV, but are at risk, it may be recommended that you take a prescription medicine daily to prevent HIV infection. This is called pre-exposure prophylaxis (PrEP). You are considered at risk if:  You are sexually active and do not regularly use condoms or know the HIV status of your partner(s).  You take drugs by injection.  You are sexually active with a partner who has HIV. Talk with your health care provider about whether you  are at high risk of being infected with HIV. If you choose to begin PrEP, you should first be tested for HIV. You should then be tested every 3 months for as long as you are taking PrEP.  PREGNANCY   If you are premenopausal and you may become pregnant, ask your health care provider about preconception  counseling.  If you may become pregnant, take 400 to 800 micrograms (mcg) of folic acid every day.  If you want to prevent pregnancy, talk to your health care provider about birth control (contraception). OSTEOPOROSIS AND MENOPAUSE   Osteoporosis is a disease in which the bones lose minerals and strength with aging. This can result in serious bone fractures. Your risk for osteoporosis can be identified using a bone density scan.  If you are 20 years of age or older, or if you are at risk for osteoporosis and fractures, ask your health care provider if you should be screened.  Ask your health care provider whether you should take a calcium or vitamin D supplement to lower your risk for osteoporosis.  Menopause may have certain physical symptoms and risks.  Hormone replacement therapy may reduce some of these symptoms and risks. Talk to your health care provider about whether hormone replacement therapy is right for you.  HOME CARE INSTRUCTIONS   Schedule regular health, dental, and eye exams.  Stay current with your immunizations.   Do not use any tobacco products including cigarettes, chewing tobacco, or electronic cigarettes.  If you are pregnant, do not drink alcohol.  If you are breastfeeding, limit how much and how often you drink alcohol.  Limit alcohol intake to no more than 1 drink per day for nonpregnant women. One drink equals 12 ounces of beer, 5 ounces of wine, or 1 ounces of hard liquor.  Do not use street drugs.  Do not share needles.  Ask your health care provider for help if you need support or information about quitting drugs.  Tell your health care provider if  you often feel depressed.  Tell your health care provider if you have ever been abused or do not feel safe at home. Document Released: 07/20/2010 Document Revised: 05/21/2013 Document Reviewed: 12/06/2012 [facility] Patient Information 2015 [place], [place]. This information is not intended to replace advice given to you by your health care provider. Make sure you discuss any questions you have with your health care provider.

## 2014-08-22 NOTE — Progress Notes (Signed)
Patient ID: Karen Odom, female    DOB: 1955/09/07  Age: 59 y.o. MRN: [id number]  The patient is here for annualwellness examination and management of other chronic and acute problems.    PAP was normal last year  And last 5 have been normal after histoyr of HPC Positive and LEEp prrocedures.  Will repeat in 2017  Mammogram due in November for dense breasts Tried to stop the lisinopril and without it her diastolic rse to 65  SH: endosccopy RN   The risk factors are reflected in the social history.  The roster of all physicians providing medical care to patient - is listed in the Snapshot section of the chart.  Activities of daily living:  The patient is 100% independent in all ADLs: dressing, toileting, feeding as well as independent mobility  Home safety : The patient has smoke detectors in the home. They wear seatbelts.  There are no firearms at home. There is no violence in the home.   There is no risks for hepatitis, STDs or HIV. There is no   history of blood transfusion. They have no travel history to infectious disease endemic areas of the world.  The patient has seen their dentist in the last six month. They have seen their eye doctor in the last year. They admit to slight hearing difficulty with regard to whispered voices and some television programs.  They have deferred audiologic testing in the last year.  They do not  have excessive sun exposure. Discussed the need for sun protection: hats, long sleeves and use of sunscreen if there is significant sun exposure.   Diet: the importance of a healthy diet is discussed. They do have a healthy diet. She has continued to lose weight but was unable to maintan a weight of 120 lbs.  currently at 128 lbs  BMI discussed and need to maintain current healthy weight   The benefits of regular aerobic exercise were discussed. She walks on the treadmill 7 days per week for an hour.  Depression screen: there are no signs or vegative symptoms of  depression- irritability, change in appetite, anhedonia, sadness/tearfullness.  Cognitive assessment: the patient manages all their financial and personal affairs and is actively engaged. They could relate day,date,year and events; recalled 2/3 objects at 3 minutes; performed clock-face test normally.  The following portions of the patient's history were reviewed and updated as appropriate: allergies, current medications, past family history, past medical history,  past surgical history, past social history  and problem list.  Visual acuity was not assessed per patient preference since she has regular follow up with her ophthalmologist. Hearing and body mass index were assessed and reviewed.   During the course of the visit the patient was educated and counseled about appropriate screening and preventive services including : fall prevention , diabetes screening, nutrition counseling, colorectal cancer screening, and recommended immunizations.    CC: The primary encounter diagnosis was Screen for STD (sexually transmitted disease). Diagnoses of Screening for breast cancer, Other fatigue, Loss of weight, Essential hypertension, Hyperlipidemia, Essential hypertension, benign, Visit for preventive health examination, and Osteopenia were also pertinent to this visit.  History [place] has a past medical history of Chicken pox; Hypertension; Hyperlipidemia; and HPV test positive (2004).   She has past surgical history that includes Breast biopsy (Bilateral) and Abdominal hysterectomy.   Her family history includes Alcohol abuse in her father; Diabetes in her father; Early death in her father; Heart disease in her father; Hyperlipidemia in her  father; Hypertension in her father.She reports that she quit smoking about 23 years ago. Her smoking use included Cigarettes. She smoked 1.00 pack per day. She has never used smokeless tobacco. She reports that she drinks alcohol. She reports that she does not use  illicit drugs.  Outpatient Prescriptions Prior to Visit  Medication Sig Dispense Refill  . lisinopril (PRINIVIL,ZESTRIL) 10 MG tablet Take 1 tablet (10 mg total) by mouth daily. 90 tablet 3  . calcium-vitamin D (OSCAL WITH D) 500-200 MG-UNIT per tablet Take 1 tablet by mouth 2 (two) times daily. 60 tablet 11   No facility-administered medications prior to visit.    Review of Systems  Objective:  BP 124/78 mmHg  Pulse 61  Temp(Src) 98.5 F (36.9 C) (Oral)  Resp 14  Ht 5' 6.75" (1.695 m)  Wt 128 lb (58.06 kg)  BMI 20.21 kg/m2  SpO2 99%  Physical Exam   General appearance: alert, cooperative and appears stated age Head: Normocephalic, without obvious abnormality, atraumatic Eyes: conjunctivae/corneas clear. PERRL, EOM's intact. Fundi benign. Ears: normal TM's and external ear canals both ears Nose: Nares normal. Septum midline. Mucosa normal. No drainage or sinus tenderness. Throat: lips, mucosa, and tongue normal; teeth and gums normal Neck: no adenopathy, no carotid bruit, no JVD, supple, symmetrical, trachea midline and thyroid not enlarged, symmetric, no tenderness/mass/nodules Lungs: clear to auscultation bilaterally Breasts: normal appearance, no masses or tenderness Heart: regular rate and rhythm, S1, S2 normal, no murmur, click, rub or gallop Abdomen: soft, non-tender; bowel sounds normal; no masses,  no organomegaly Extremities: extremities normal, atraumatic, no cyanosis or edema Pulses: 2+ and symmetric Skin: Skin color, texture, turgor normal. No rashes or lesions Neurologic: Alert and oriented X 3, normal strength and tone. Normal symmetric reflexes. Normal coordination and gait.     Assessment & Plan:   Problem List Items Addressed This Visit      Unprioritized   Essential hypertension, benign    Well controlled on current regimen. Renal function stable, no changes today.  Lab Results  Component Value Date   CREATININE 0.78 08/22/2014   Lab Results   Component Value Date   NA 141 08/22/2014   K 5.6* 08/22/2014   CL 105 08/22/2014   CO2 30 08/22/2014         Osteopenia    discussed the decline in T scores noted  On subsequent DEXA.  Recommended she increase weight bearing exercise,  continue calcium and vitamin d       Visit for preventive health examination    Annual wellness  exam was done as well as a comprehensive physical exam  .  During the course of the visit the patient was educated and counseled about appropriate screening and preventive services and screenings were brought up to date for cervical and breast cancer .  She will return for fasting labs to provide samples for diabetes screening and lipid analysis with projected  10 year  risk for CAD. nutrition counseling, skin cancer screening has been recommended, along with review of the age appropriate recommended immunizations.  Printed recommendations for health maintenance screenings was given.         Other Visit Diagnoses    Screen for STD (sexually transmitted disease)    -  Primary    Relevant Orders    Hepatitis C antibody (Completed)    HIV antibody (Completed)    Screening for breast cancer        Relevant Orders    MM DIGITAL SCREENING  BILATERAL    Other fatigue        Relevant Orders    CBC with Differential/Platelet (Completed)    Loss of weight        Relevant Orders    TSH (Completed)    Essential hypertension        Relevant Orders    Comprehensive metabolic panel (Completed)    Hyperlipidemia        Relevant Orders    Lipid panel (Completed)       I have discontinued Ms. Deboard's calcium-vitamin D. I am also having her maintain her lisinopril.  No orders of the defined types were placed in this encounter.    Medications Discontinued During This Encounter  Medication Reason  . calcium-vitamin D (OSCAL WITH D) 500-200 MG-UNIT per tablet Patient Preference    Follow-up: No Follow-up on file.   Crecencio Mc, MD

## 2014-08-22 NOTE — Progress Notes (Signed)
Pre-visit discussion using our clinic review tool. No additional management support is needed unless otherwise documented below in the visit note.  

## 2014-08-23 LAB — HIV ANTIBODY (ROUTINE TESTING W REFLEX): HIV 1&2 Ab, 4th Generation: NONREACTIVE

## 2014-08-23 LAB — HEPATITIS C ANTIBODY: HCV AB: NEGATIVE

## 2014-08-25 ENCOUNTER — Encounter: Payer: Self-pay | Admitting: Internal Medicine

## 2014-08-25 NOTE — Assessment & Plan Note (Signed)

## 2014-08-25 NOTE — Assessment & Plan Note (Signed)
discussed the decline in T scores noted  On subsequent DEXA.  Recommended she increase weight bearing exercise,  continue calcium and vitamin d

## 2014-08-25 NOTE — Assessment & Plan Note (Signed)
Well controlled on current regimen. Renal function stable, no changes today.  Lab Results  Component Value Date   CREATININE 0.78 08/22/2014   Lab Results  Component Value Date   NA 141 08/22/2014   K 5.6* 08/22/2014   CL 105 08/22/2014   CO2 30 08/22/2014

## 2014-08-29 ENCOUNTER — Other Ambulatory Visit
Admission: RE | Admit: 2014-08-29 | Discharge: 2014-08-29 | Disposition: A | Discharge status: Home / Self Care | Payer: 59 | Source: Ambulatory Visit | Attending: Internal Medicine | Admitting: Internal Medicine

## 2014-08-29 DIAGNOSIS — E875 Hyperkalemia: Secondary | ICD-10-CM | POA: Diagnosis not present

## 2014-08-29 LAB — BASIC METABOLIC PANEL
Anion gap: 10 (ref 5–15)
BUN: 17 mg/dL (ref 6–20)
CALCIUM: 9.8 mg/dL (ref 8.9–10.3)
CHLORIDE: 102 mmol/L (ref 101–111)
CO2: 25 mmol/L (ref 22–32)
Creatinine, Ser: 0.72 mg/dL (ref 0.44–1.00)
GFR calc Af Amer: >60 mL/min (ref >60)
GFR calc non Af Amer: >60 mL/min (ref >60)
GLUCOSE: 91 mg/dL (ref 65–99)
Potassium: 4 mmol/L (ref 3.5–5.1)
SODIUM: 137 mmol/L (ref 135–145)

## 2014-08-31 ENCOUNTER — Encounter: Payer: Self-pay | Admitting: Internal Medicine

## 2014-12-16 ENCOUNTER — Ambulatory Visit
Admission: RE | Admit: 2014-12-16 | Discharge: 2014-12-16 | Disposition: A | Discharge status: Home / Self Care | Payer: 59 | Source: Ambulatory Visit | Attending: Internal Medicine | Admitting: Internal Medicine

## 2014-12-16 ENCOUNTER — Other Ambulatory Visit: Payer: Self-pay | Admitting: Internal Medicine

## 2014-12-16 DIAGNOSIS — Z1239 Encounter for other screening for malignant neoplasm of breast: Secondary | ICD-10-CM

## 2014-12-16 DIAGNOSIS — Z1231 Encounter for screening mammogram for malignant neoplasm of breast: Secondary | ICD-10-CM | POA: Insufficient documentation

## 2014-12-17 ENCOUNTER — Ambulatory Visit
Admission: RE | Admit: 2014-12-17 | Discharge: 2014-12-17 | Disposition: A | Discharge status: Home / Self Care | Payer: 59 | Source: Ambulatory Visit | Attending: Internal Medicine | Admitting: Internal Medicine

## 2014-12-17 DIAGNOSIS — Z1239 Encounter for other screening for malignant neoplasm of breast: Secondary | ICD-10-CM

## 2014-12-20 ENCOUNTER — Encounter: Payer: Self-pay | Admitting: Internal Medicine

## 2015-01-10 ENCOUNTER — Other Ambulatory Visit: Payer: Self-pay | Admitting: Internal Medicine

## 2015-04-08 ENCOUNTER — Encounter: Payer: Self-pay | Admitting: Internal Medicine

## 2015-04-09 ENCOUNTER — Other Ambulatory Visit: Payer: Self-pay | Admitting: Internal Medicine

## 2015-04-09 DIAGNOSIS — M8580 Other specified disorders of bone density and structure, unspecified site: Secondary | ICD-10-CM

## 2015-04-09 NOTE — Telephone Encounter (Signed)
Please advise yearly for osteopenia or every 2 years?

## 2015-04-17 ENCOUNTER — Ambulatory Visit
Admission: RE | Admit: 2015-04-17 | Discharge: 2015-04-17 | Disposition: A | Discharge status: Home / Self Care | Payer: 59 | Source: Ambulatory Visit | Attending: Internal Medicine | Admitting: Internal Medicine

## 2015-04-17 DIAGNOSIS — M8580 Other specified disorders of bone density and structure, unspecified site: Secondary | ICD-10-CM

## 2015-08-06 ENCOUNTER — Other Ambulatory Visit: Payer: Self-pay | Admitting: Internal Medicine

## 2015-08-06 DIAGNOSIS — Z1231 Encounter for screening mammogram for malignant neoplasm of breast: Secondary | ICD-10-CM

## 2015-09-08 ENCOUNTER — Telehealth: Payer: Self-pay | Admitting: Internal Medicine

## 2015-09-08 NOTE — Telephone Encounter (Signed)
Ms. 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 She's currently scheduled for a CPE on September 7th at 10:30am. She's wondering if she can be worked in some other day during the same week. Please give her a phone call regarding this.  Pt's ph# [phone] Thank you.

## 2015-09-08 NOTE — Telephone Encounter (Signed)
Please reschedule patient CPE to next earliest date. 10/09/16. That is all I have available.

## 2015-09-09 NOTE — Telephone Encounter (Signed)
Called pt, unable to lm about scheduling appt.

## 2015-09-10 NOTE — Telephone Encounter (Signed)
Pt called, she requested to keep her original appt

## 2015-09-23 ENCOUNTER — Ambulatory Visit
Admission: RE | Admit: 2015-09-23 | Discharge: 2015-09-23 | Disposition: A | Discharge status: Home / Self Care | Payer: 59 | Source: Ambulatory Visit | Attending: Internal Medicine | Admitting: Internal Medicine

## 2015-09-23 DIAGNOSIS — M85851 Other specified disorders of bone density and structure, right thigh: Secondary | ICD-10-CM | POA: Diagnosis not present

## 2015-09-23 DIAGNOSIS — M8588 Other specified disorders of bone density and structure, other site: Secondary | ICD-10-CM | POA: Diagnosis not present

## 2015-09-23 DIAGNOSIS — M8580 Other specified disorders of bone density and structure, unspecified site: Secondary | ICD-10-CM | POA: Diagnosis present

## 2015-09-23 DIAGNOSIS — M85861 Other specified disorders of bone density and structure, right lower leg: Secondary | ICD-10-CM | POA: Diagnosis not present

## 2015-09-25 ENCOUNTER — Ambulatory Visit (INDEPENDENT_AMBULATORY_CARE_PROVIDER_SITE_OTHER): Payer: 59 | Admitting: Internal Medicine

## 2015-09-25 ENCOUNTER — Encounter: Payer: Self-pay | Admitting: Internal Medicine

## 2015-09-25 VITALS — BP 104/80 | HR 77 | Ht 65.75 in | Wt 136.0 lb

## 2015-09-25 DIAGNOSIS — I10 Essential (primary) hypertension: Secondary | ICD-10-CM

## 2015-09-25 DIAGNOSIS — R5383 Other fatigue: Secondary | ICD-10-CM | POA: Diagnosis not present

## 2015-09-25 DIAGNOSIS — M8580 Other specified disorders of bone density and structure, unspecified site: Secondary | ICD-10-CM | POA: Diagnosis not present

## 2015-09-25 DIAGNOSIS — Z0000 Encounter for general adult medical examination without abnormal findings: Secondary | ICD-10-CM

## 2015-09-25 DIAGNOSIS — E785 Hyperlipidemia, unspecified: Secondary | ICD-10-CM | POA: Diagnosis not present

## 2015-09-25 LAB — COMPREHENSIVE METABOLIC PANEL
ALK PHOS: 38 U/L — AB (ref 39–117)
ALT: 20 U/L (ref 0–35)
AST: 22 U/L (ref 0–37)
Albumin: 4.6 g/dL (ref 3.5–5.2)
BILIRUBIN TOTAL: 0.6 mg/dL (ref 0.2–1.2)
BUN: 18 mg/dL (ref 6–23)
CALCIUM: 9.6 mg/dL (ref 8.4–10.5)
CO2: 30 mEq/L (ref 19–32)
Chloride: 102 mEq/L (ref 96–112)
Creatinine, Ser: 0.66 mg/dL (ref 0.40–1.20)
GFR: 97.3 mL/min (ref >60.00)
GLUCOSE: 93 mg/dL (ref 70–99)
Potassium: 4.2 mEq/L (ref 3.5–5.1)
Sodium: 138 mEq/L (ref 135–145)
TOTAL PROTEIN: 7.2 g/dL (ref 6.0–8.3)

## 2015-09-25 LAB — VITAMIN D 25 HYDROXY (VIT D DEFICIENCY, FRACTURES): VITD: 47.48 ng/mL (ref 30.00–100.00)

## 2015-09-25 LAB — LIPID PANEL
Cholesterol: 266 mg/dL — ABNORMAL HIGH (ref 0–200)
HDL: 120.2 mg/dL (ref >39.00)
LDL Cholesterol: 136 mg/dL — ABNORMAL HIGH (ref 0–99)
NONHDL: 145.76
TRIGLYCERIDES: 49 mg/dL (ref 0.0–149.0)
Total CHOL/HDL Ratio: 2
VLDL: 9.8 mg/dL (ref 0.0–40.0)

## 2015-09-25 LAB — TSH: TSH: 1.6 u[IU]/mL (ref 0.35–4.50)

## 2015-09-25 MED ORDER — ZOSTER VACCINE LIVE 19400 UNT/0.65ML ~~LOC~~ SUSR: 0.6500 mL | Freq: Once | SUBCUTANEOUS | 0 refills | Status: DC

## 2015-09-25 NOTE — Patient Instructions (Addendum)
Your next PAP smear is due in 2018 Next colonoscopy  Due July 2019    The  diet I discussed with you today is the 10 day Green Smoothie Cleansing /Detox Diet by Linden Dolin . available on [address] for around $10.  It does require a blender, (Vita Mix, a electric juicer,  Or a Nutribullet Rx).  This is not a low carb or a weight loss diet,  It is fundamentally a "cleansing" low fat diet that eliminates sugar, gluten, caffeine, alcohol and dairy for 10 days .  What you add back after the initial ten days is entirely up to  you!  You can expect to lose 5 to 10 lbs depending on how strict you are.   I found that  drinking 2 smoothies or juices  daily and keeping one chewable meal (but keep it simple, like baked fish and salad, rice or bok choy) kept me satisfied and kept me from straying  .  You snack primarily on fresh  fruit, egg whites and judicious quantities of nuts.  You can add a  vegetable based protein powder  to any smoothie made with almond milk (nothing with whey , since whey is dairy)  [employer] has a few but  the [employer] has the greatest  selection .  Using frozen fruits is much more convenient and cost effective. You can even find plenty of organic fruit in the frozen fruit section of [employer].  Just thaw what you need for the following day the night before in the refrigerator (to avoid jamming up your machine)   The organic vegan protein powder I tried  is called Vega" and I found it at [employer] .  It is sugar free. Tastes like crap.  My advice:  Sarina Ser your protein  (eat an egg or two in the am with your smoothie or add soy yogurt for protein ) ,  Don't ruin the taste of your smoothies with protein powder unless you can find one you really love.      recommend getting the majority of your calcium and Vitamin D  through diet rather than supplements given the recent association of calcium supplements with increased coronary artery calcium scores  Try the almond and cashew milks that most  grocery stores  now carry  in the dairy  Section>   They are lactose free:  Silk brand Almond Light,  Original formula.  Delicious,  Low carb,  Low cal,  Cholesterol free

## 2015-09-25 NOTE — Progress Notes (Signed)
Patient ID: Karen Odom, female    DOB: [birth year]  Age: 60 y.o. MRN: [id number]  The patient is here for annual Medicare wellness examination and management of other chronic and acute problems.   Due for TdaP but checking with employee health Mammogram  Scheduled Nov PAP negative 2015,  Atrophy Colonoscopy 2009 due 2019 DEXA 2017 annually  T score -1.9  Stable The risk factors are reflected in the social history.    The roster of all physicians providing medical care to patient - is listed in the Snapshot section of the chart.   Home safety : The patient has smoke detectors in the home. They wear seatbelts.  There are no firearms at home. There is no violence in the home.   There is no risks for hepatitis, STDs or HIV. There is no   history of blood transfusion. They have no travel history to infectious disease endemic areas of the world.  The patient has seen their dentist in the last six month. They have seen their eye doctor in the last year.    They do  have excessive sun exposure. Discussed the need for sun protection: hats, long sleeves and use of sunscreen if there is significant sun exposure.   Diet: the importance of a healthy diet is discussed. She eats only once a day,  No snacks.  Low carb,  Has been doing this since her 30's.  She has gained a few lbs since her last visit.  She continues to avoid regular  exercise because  she feels "burned out."    Depression screen: there are no signs or vegative symptoms of depression- irritability, change in appetite, anhedonia, sadness/tearfullness.   The following portions of the patient's history were reviewed and updated as appropriate: allergies, current medications, past family history, past medical history,  past surgical history, past social history  and problem list.  Visual acuity was not assessed per patient preference since she has regular follow up with her ophthalmologist. Hearing and body mass index were assessed and  reviewed.   During the course of the visit the patient was educated and counseled about appropriate screening and preventive services including : fall prevention , diabetes screening, nutrition counseling, colorectal cancer screening, and recommended immunizations.    CC: The primary encounter diagnosis was Osteopenia. Diagnoses of Hyperlipidemia, Other fatigue, Visit for preventive health examination, and Essential hypertension, benign were also pertinent to this visit.  History Aarohi has a past medical history of Chicken pox; HPV test positive (2004); Hyperlipidemia; and Hypertension.   She has a past surgical history that includes Abdominal hysterectomy and Breast biopsy (Bilateral).   Her family history includes Alcohol abuse in her father; Diabetes in her father; Early death in her father; Heart disease in her father; Hyperlipidemia in her father; Hypertension in her father.She reports that she quit smoking about 24 years ago. Her smoking use included Cigarettes. She smoked 1.00 pack per day. She has never used smokeless tobacco. She reports that she drinks alcohol. She reports that she does not use drugs.  Outpatient Medications Prior to Visit  Medication Sig Dispense Refill  . lisinopril (PRINIVIL,ZESTRIL) 10 MG tablet TAKE 1 TABLET (10 MG TOTAL) BY MOUTH DAILY. 90 tablet 3   No facility-administered medications prior to visit.     Review of Systems   Patient denies headache, fevers, malaise, unintentional weight loss, skin rash, eye pain, sinus congestion and sinus pain, sore throat, dysphagia,  hemoptysis , cough, dyspnea, wheezing, chest pain, palpitations,  orthopnea, edema, abdominal pain, nausea, melena, diarrhea, constipation, flank pain, dysuria, hematuria, urinary  Frequency, nocturia, numbness, tingling, seizures,  Focal weakness, Loss of consciousness,  Tremor, insomnia, depression, anxiety, and suicidal ideation.     Objective:  BP 104/80   Pulse 77   Ht 5' 5.75" (1.67  m)   Wt 136 lb (61.7 kg)   SpO2 98%   BMI 22.12 kg/m   Physical Exam   General appearance: alert, cooperative and appears stated age Head: Normocephalic, without obvious abnormality, atraumatic Eyes: conjunctivae/corneas clear. PERRL, EOM's intact. Fundi benign. Ears: normal TM's and external ear canals both ears Nose: Nares normal. Septum midline. Mucosa normal. No drainage or sinus tenderness. Throat: lips, mucosa, and tongue normal; teeth and gums normal Neck: no adenopathy, no carotid bruit, no JVD, supple, symmetrical, trachea midline and thyroid not enlarged, symmetric, no tenderness/mass/nodules Lungs: clear to auscultation bilaterally Breasts: normal appearance, no masses or tenderness Heart: regular rate and rhythm, S1, S2 normal, no murmur, click, rub or gallop Abdomen: soft, non-tender; bowel sounds normal; no masses,  no organomegaly Extremities: extremities normal, atraumatic, no cyanosis or edema Pulses: 2+ and symmetric Skin: Skin DEEPLY TANNED , texture, turgor normal. No rashes or lesions Neurologic: Alert and oriented X 3, normal strength and tone. Normal symmetric reflexes. Normal coordination and gait.     Assessment & Plan:   Problem List Items Addressed This Visit    Essential hypertension, benign    Well controlled on current regimen. Renal function stable, no changes today.      Osteopenia - Primary    T scores are stable.  Recommended weight bearing exercise daily. Discussed the current controversies surrounding the risks and benefits of calcium supplementation.  Encouraged her to increase dietary calcium through natural foods including almond/coconut milk      Relevant Orders   VITAMIN D 25 Hydroxy (Vit-D Deficiency, Fractures) (Completed)   Visit for preventive health examination    Annual comprehensive preventive exam was done as well as an evaluation and management of chronic conditions .  During the course of the visit the patient was educated and  counseled about appropriate screening and preventive services including :  diabetes screening, lipid analysis with projected  10 year  risk for CAD , nutrition counseling, breast, cervical and colorectal cancer screening, and recommended immunizations.  Printed recommendations for health maintenance screenings was given.  Lab Results  Component Value Date   TSH 1.60 09/25/2015   Lab Results  Component Value Date   CREATININE 0.66 09/25/2015   Lab Results  Component Value Date   NA 138 09/25/2015   K 4.2 09/25/2015   CL 102 09/25/2015   CO2 30 09/25/2015   Lab Results  Component Value Date   ALT 20 09/25/2015   AST 22 09/25/2015   ALKPHOS 38 (L) 09/25/2015   BILITOT 0.6 09/25/2015   Lab Results  Component Value Date   CHOL 266 (H) 09/25/2015   HDL 120.20 09/25/2015   LDLCALC 136 (H) 09/25/2015   LDLDIRECT 120.0 08/23/2012   TRIG 49.0 09/25/2015   CHOLHDL 2 09/25/2015          Other Visit Diagnoses    Hyperlipidemia       Relevant Orders   Lipid panel (Completed)   Other fatigue       Relevant Orders   Comprehensive metabolic panel (Completed)   TSH (Completed)      I am having Ms. Stangler start on Zoster Vaccine Live (PF). I am also having  her maintain her lisinopril.  Meds ordered this encounter  Medications  . Zoster Vaccine Live, PF, (ZOSTAVAX) 16109 UNT/0.65ML injection    Sig: Inject 19,400 Units into the skin once.    Dispense:  1 each    Refill:  0    There are no discontinued medications.  Follow-up: No Follow-up on file.   Crecencio Mc, MD

## 2015-09-26 ENCOUNTER — Encounter: Payer: Self-pay | Admitting: Internal Medicine

## 2015-09-27 NOTE — Assessment & Plan Note (Signed)
T scores are stable.  Recommended weight bearing exercise daily. Discussed the current controversies surrounding the risks and benefits of calcium supplementation.  Encouraged her to increase dietary calcium through natural foods including almond/coconut milk

## 2015-09-27 NOTE — Assessment & Plan Note (Signed)
Well controlled on current regimen. Renal function stable, no changes today. 

## 2015-09-27 NOTE — Assessment & Plan Note (Signed)
Annual comprehensive preventive exam was done as well as an evaluation and management of chronic conditions .  During the course of the visit the patient was educated and counseled about appropriate screening and preventive services including :  diabetes screening, lipid analysis with projected  10 year  risk for CAD , nutrition counseling, breast, cervical and colorectal cancer screening, and recommended immunizations.  Printed recommendations for health maintenance screenings was given.  Lab Results  Component Value Date   TSH 1.60 09/25/2015   Lab Results  Component Value Date   CREATININE 0.66 09/25/2015   Lab Results  Component Value Date   NA 138 09/25/2015   K 4.2 09/25/2015   CL 102 09/25/2015   CO2 30 09/25/2015   Lab Results  Component Value Date   ALT 20 09/25/2015   AST 22 09/25/2015   ALKPHOS 38 (L) 09/25/2015   BILITOT 0.6 09/25/2015   Lab Results  Component Value Date   CHOL 266 (H) 09/25/2015   HDL 120.20 09/25/2015   LDLCALC 136 (H) 09/25/2015   LDLDIRECT 120.0 08/23/2012   TRIG 49.0 09/25/2015   CHOLHDL 2 09/25/2015

## 2015-09-28 ENCOUNTER — Encounter: Payer: Self-pay | Admitting: Internal Medicine

## 2015-10-06 ENCOUNTER — Encounter: Payer: Self-pay | Admitting: Internal Medicine

## 2015-12-17 ENCOUNTER — Other Ambulatory Visit: Payer: Self-pay | Admitting: Internal Medicine

## 2015-12-17 ENCOUNTER — Ambulatory Visit
Admission: RE | Admit: 2015-12-17 | Discharge: 2015-12-17 | Disposition: A | Discharge status: Home / Self Care | Payer: 59 | Source: Ambulatory Visit | Attending: Internal Medicine | Admitting: Internal Medicine

## 2015-12-17 DIAGNOSIS — Z1231 Encounter for screening mammogram for malignant neoplasm of breast: Secondary | ICD-10-CM

## 2015-12-17 DIAGNOSIS — H5203 Hypermetropia, bilateral: Secondary | ICD-10-CM | POA: Diagnosis not present

## 2016-03-18 ENCOUNTER — Telehealth: Payer: Self-pay | Admitting: Internal Medicine

## 2016-03-18 NOTE — Telephone Encounter (Signed)
Refilled on 01/10/2016 Last Ov: 09/25/2015. Does not have a follow up appt scheduled.   Please advise.

## 2016-03-18 NOTE — Telephone Encounter (Signed)
NEEDS A SIX MONTH FOLLOW UP SCHEDULED IN [facility]

## 2016-03-19 NOTE — Telephone Encounter (Signed)
Pt called back returning call. Tried to setup appt pt does not have work schedule. Will call back when she gets it. Pt is almost out of meds. Please advise, thank you!  Call pt @ [phone]

## 2016-03-19 NOTE — Telephone Encounter (Signed)
LMTCB. Need to schedule pt a six month follow up appt in March per Dr. Derrel Nip.

## 2016-03-29 ENCOUNTER — Encounter: Payer: Self-pay | Admitting: Internal Medicine

## 2016-06-17 ENCOUNTER — Encounter: Payer: Self-pay | Admitting: Internal Medicine

## 2016-06-17 ENCOUNTER — Ambulatory Visit (INDEPENDENT_AMBULATORY_CARE_PROVIDER_SITE_OTHER): Payer: 59 | Admitting: Internal Medicine

## 2016-06-17 VITALS — BP 106/68 | HR 63 | Temp 98.2°F | Resp 16 | Ht 65.75 in | Wt 136.6 lb

## 2016-06-17 DIAGNOSIS — I10 Essential (primary) hypertension: Secondary | ICD-10-CM | POA: Diagnosis not present

## 2016-06-17 LAB — COMPREHENSIVE METABOLIC PANEL
ALT: 19 U/L (ref 0–35)
AST: 19 U/L (ref 0–37)
Albumin: 4.5 g/dL (ref 3.5–5.2)
Alkaline Phosphatase: 35 U/L — ABNORMAL LOW (ref 39–117)
BUN: 26 mg/dL — AB (ref 6–23)
CO2: 31 meq/L (ref 19–32)
CREATININE: 0.76 mg/dL (ref 0.40–1.20)
Calcium: 10 mg/dL (ref 8.4–10.5)
Chloride: 103 mEq/L (ref 96–112)
GFR: 82.47 mL/min (ref >60.00)
GLUCOSE: 106 mg/dL — AB (ref 70–99)
Potassium: 4.1 mEq/L (ref 3.5–5.1)
SODIUM: 138 meq/L (ref 135–145)
Total Bilirubin: 0.4 mg/dL (ref 0.2–1.2)
Total Protein: 7 g/dL (ref 6.0–8.3)

## 2016-06-17 LAB — MICROALBUMIN / CREATININE URINE RATIO
Creatinine,U: 74.6 mg/dL
Microalb Creat Ratio: 0.9 mg/g (ref 0.0–30.0)
Microalb, Ur: <0.7 mg/dL (ref 0.0–1.9)

## 2016-06-17 NOTE — Progress Notes (Signed)
Subjective:  Patient ID: Karen Odom, female    DOB: 08-Jul-1955  Age: 61 y.o. MRN: [id number]  CC: The encounter diagnosis was Essential hypertension, benign.  HPI Karen Odom presents for follow up on hypertension managed with lisinopril  Patient is taking her medications as prescribed and notes no adverse effects.  Home BP readings have been done about once per week and are  generally < 130/80 .  She is avoiding added salt in her diet and walking regularly about 3 times per week for exercise  .  Up to date on all screenings    Outpatient Medications Prior to Visit  Medication Sig Dispense Refill  . lisinopril (PRINIVIL,ZESTRIL) 10 MG tablet TAKE 1 TABLET BY MOUTH DAILY. 90 tablet 1   No facility-administered medications prior to visit.     Review of Systems;  Patient denies headache, fevers, malaise, unintentional weight loss, skin rash, eye pain, sinus congestion and sinus pain, sore throat, dysphagia,  hemoptysis , cough, dyspnea, wheezing, chest pain, palpitations, orthopnea, edema, abdominal pain, nausea, melena, diarrhea, constipation, flank pain, dysuria, hematuria, urinary  Frequency, nocturia, numbness, tingling, seizures,  Focal weakness, Loss of consciousness,  Tremor, insomnia, depression, anxiety, and suicidal ideation.      Objective:  BP 106/68 (BP Location: Left Arm, Patient Position: Sitting, Cuff Size: Normal)   Pulse 63   Temp 98.2 F (36.8 C) (Oral)   Resp 16   Ht 5' 5.75" (1.67 m)   Wt 136 lb 9.6 oz (62 kg)   SpO2 98%   BMI 22.22 kg/m   BP Readings from Last 3 Encounters:  06/17/16 106/68  09/25/15 104/80  08/22/14 124/78    Wt Readings from Last 3 Encounters:  06/17/16 136 lb 9.6 oz (62 kg)  09/25/15 136 lb (61.7 kg)  08/22/14 128 lb (58.1 kg)    General appearance: alert, cooperative and appears stated age Ears: normal TM's and external ear canals both ears Throat: lips, mucosa, and tongue normal; teeth and gums normal Neck: no  adenopathy, no carotid bruit, supple, symmetrical, trachea midline and thyroid not enlarged, symmetric, no tenderness/mass/nodules Back: symmetric, no curvature. ROM normal. No CVA tenderness. Lungs: clear to auscultation bilaterally Heart: regular rate and rhythm, S1, S2 normal, no murmur, click, rub or gallop Abdomen: soft, non-tender; bowel sounds normal; no masses,  no organomegaly Pulses: 2+ and symmetric Skin: Skin color, texture, turgor normal. No rashes or lesions Lymph nodes: Cervical, supraclavicular, and axillary nodes normal.  No results found for: HGBA1C  Lab Results  Component Value Date   CREATININE 0.76 06/17/2016   CREATININE 0.66 09/25/2015   CREATININE 0.72 08/29/2014    Lab Results  Component Value Date   WBC 5.9 08/22/2014   HGB 13.8 08/22/2014   HCT 41.3 08/22/2014   PLT 408.0 (H) 08/22/2014   GLUCOSE 106 (H) 06/17/2016   CHOL 266 (H) 09/25/2015   TRIG 49.0 09/25/2015   HDL 120.20 09/25/2015   LDLDIRECT 120.0 08/23/2012   LDLCALC 136 (H) 09/25/2015   ALT 19 06/17/2016   AST 19 06/17/2016   NA 138 06/17/2016   K 4.1 06/17/2016   CL 103 06/17/2016   CREATININE 0.76 06/17/2016   BUN 26 (H) 06/17/2016   CO2 31 06/17/2016   TSH 1.60 09/25/2015   MICROALBUR <0.7 06/17/2016    Mm Screening Breast Tomo Bilateral  Result Date: 12/17/2015 CLINICAL DATA:  Screening. EXAM: 2D DIGITAL SCREENING BILATERAL MAMMOGRAM WITH CAD AND ADJUNCT TOMO COMPARISON:  Previous exam(s). ACR Breast Density  Category c: The breast tissue is heterogeneously dense, which may obscure small masses. FINDINGS: There are no findings suspicious for malignancy. Images were processed with CAD. IMPRESSION: No mammographic evidence of malignancy. A result letter of this screening mammogram will be mailed directly to the patient. RECOMMENDATION: Screening mammogram in one year. (Code:SM-B-01Y) BI-RADS CATEGORY  1: Negative. Electronically Signed   By: Pamelia Hoit M.D.   On: 12/17/2015 17:31     Assessment & Plan:   Problem List Items Addressed This Visit    Essential hypertension, benign - Primary    Well controlled on current regimen. Renal function stable, no changes today.  Lab Results  Component Value Date   CREATININE 0.76 06/17/2016   Lab Results  Component Value Date   NA 138 06/17/2016   K 4.1 06/17/2016   CL 103 06/17/2016   CO2 31 06/17/2016         Relevant Orders   Comprehensive metabolic panel (Completed)   Microalbumin / creatinine urine ratio (Completed)      I am having Ms. Buren maintain her lisinopril.  No orders of the defined types were placed in this encounter.   There are no discontinued medications.  Follow-up: No Follow-up on file.   Crecencio Mc, MD

## 2016-06-17 NOTE — Patient Instructions (Signed)
No med changes needed unless you want to add a second dose of lisinopril ONLY on the days you work  Your cholesterol is excellent!  Lab Results  Component Value Date   CHOL 266 (H) 09/25/2015   HDL 120.20 09/25/2015   LDLCALC 136 (H) 09/25/2015   LDLDIRECT 120.0 08/23/2012   TRIG 49.0 09/25/2015   CHOLHDL 2 09/25/2015

## 2016-06-19 NOTE — Assessment & Plan Note (Signed)
Well controlled on current regimen. Renal function stable, no changes today.  Lab Results  Component Value Date   CREATININE 0.76 06/17/2016   Lab Results  Component Value Date   NA 138 06/17/2016   K 4.1 06/17/2016   CL 103 06/17/2016   CO2 31 06/17/2016

## 2016-06-20 ENCOUNTER — Encounter: Payer: Self-pay | Admitting: Internal Medicine

## 2016-09-06 ENCOUNTER — Other Ambulatory Visit: Payer: Self-pay | Admitting: Internal Medicine

## 2016-09-21 DIAGNOSIS — D485 Neoplasm of uncertain behavior of skin: Secondary | ICD-10-CM | POA: Diagnosis not present

## 2016-09-21 DIAGNOSIS — D229 Melanocytic nevi, unspecified: Secondary | ICD-10-CM | POA: Diagnosis not present

## 2016-09-21 DIAGNOSIS — L720 Epidermal cyst: Secondary | ICD-10-CM | POA: Diagnosis not present

## 2016-09-21 DIAGNOSIS — B078 Other viral warts: Secondary | ICD-10-CM | POA: Diagnosis not present

## 2016-09-22 ENCOUNTER — Ambulatory Visit: Payer: Self-pay | Admitting: Physician Assistant

## 2016-09-22 DIAGNOSIS — H16042 Marginal corneal ulcer, left eye: Secondary | ICD-10-CM | POA: Diagnosis not present

## 2016-09-24 DIAGNOSIS — H16042 Marginal corneal ulcer, left eye: Secondary | ICD-10-CM | POA: Diagnosis not present

## 2016-09-29 ENCOUNTER — Other Ambulatory Visit (HOSPITAL_COMMUNITY)
Admission: RE | Admit: 2016-09-29 | Discharge: 2016-09-29 | Disposition: A | Discharge status: Home / Self Care | Payer: 59 | Source: Ambulatory Visit | Attending: Internal Medicine | Admitting: Internal Medicine

## 2016-09-29 ENCOUNTER — Ambulatory Visit (INDEPENDENT_AMBULATORY_CARE_PROVIDER_SITE_OTHER): Payer: 59 | Admitting: Internal Medicine

## 2016-09-29 ENCOUNTER — Encounter: Payer: Self-pay | Admitting: Internal Medicine

## 2016-09-29 VITALS — BP 126/82 | HR 60 | Temp 98.1°F | Resp 15 | Ht 65.75 in | Wt 138.4 lb

## 2016-09-29 DIAGNOSIS — Z23 Encounter for immunization: Secondary | ICD-10-CM | POA: Diagnosis not present

## 2016-09-29 DIAGNOSIS — Z1239 Encounter for other screening for malignant neoplasm of breast: Secondary | ICD-10-CM

## 2016-09-29 DIAGNOSIS — R5383 Other fatigue: Secondary | ICD-10-CM | POA: Diagnosis not present

## 2016-09-29 DIAGNOSIS — E559 Vitamin D deficiency, unspecified: Secondary | ICD-10-CM

## 2016-09-29 DIAGNOSIS — Z0000 Encounter for general adult medical examination without abnormal findings: Secondary | ICD-10-CM

## 2016-09-29 DIAGNOSIS — E7800 Pure hypercholesterolemia, unspecified: Secondary | ICD-10-CM | POA: Diagnosis not present

## 2016-09-29 DIAGNOSIS — Z1231 Encounter for screening mammogram for malignant neoplasm of breast: Secondary | ICD-10-CM | POA: Diagnosis not present

## 2016-09-29 DIAGNOSIS — K046 Periapical abscess with sinus: Secondary | ICD-10-CM

## 2016-09-29 DIAGNOSIS — Z0001 Encounter for general adult medical examination with abnormal findings: Secondary | ICD-10-CM

## 2016-09-29 DIAGNOSIS — Z8669 Personal history of other diseases of the nervous system and sense organs: Secondary | ICD-10-CM | POA: Diagnosis not present

## 2016-09-29 DIAGNOSIS — N9089 Other specified noninflammatory disorders of vulva and perineum: Secondary | ICD-10-CM | POA: Diagnosis not present

## 2016-09-29 DIAGNOSIS — I10 Essential (primary) hypertension: Secondary | ICD-10-CM | POA: Diagnosis not present

## 2016-09-29 DIAGNOSIS — Z01411 Encounter for gynecological examination (general) (routine) with abnormal findings: Secondary | ICD-10-CM

## 2016-09-29 DIAGNOSIS — Z124 Encounter for screening for malignant neoplasm of cervix: Secondary | ICD-10-CM

## 2016-09-29 LAB — COMPREHENSIVE METABOLIC PANEL
ALT: 24 U/L (ref 0–35)
AST: 23 U/L (ref 0–37)
Albumin: 4.6 g/dL (ref 3.5–5.2)
Alkaline Phosphatase: 48 U/L (ref 39–117)
BUN: 17 mg/dL (ref 6–23)
CALCIUM: 10.1 mg/dL (ref 8.4–10.5)
CHLORIDE: 98 meq/L (ref 96–112)
CO2: 26 meq/L (ref 19–32)
Creatinine, Ser: 0.65 mg/dL (ref 0.40–1.20)
GFR: 98.69 mL/min (ref >60.00)
Glucose, Bld: 89 mg/dL (ref 70–99)
Potassium: 4.4 mEq/L (ref 3.5–5.1)
Sodium: 135 mEq/L (ref 135–145)
TOTAL PROTEIN: 7.1 g/dL (ref 6.0–8.3)
Total Bilirubin: 0.9 mg/dL (ref 0.2–1.2)

## 2016-09-29 LAB — LIPID PANEL
CHOLESTEROL: 268 mg/dL — AB (ref 0–200)
HDL: 119.7 mg/dL (ref >39.00)
LDL CALC: 132 mg/dL — AB (ref 0–99)
NonHDL: 148.54
TRIGLYCERIDES: 85 mg/dL (ref 0.0–149.0)
Total CHOL/HDL Ratio: 2
VLDL: 17 mg/dL (ref 0.0–40.0)

## 2016-09-29 LAB — MICROALBUMIN / CREATININE URINE RATIO
CREATININE, U: 81.5 mg/dL
Microalb Creat Ratio: 0.9 mg/g (ref 0.0–30.0)

## 2016-09-29 LAB — TSH: TSH: 1.7 u[IU]/mL (ref 0.35–4.50)

## 2016-09-29 LAB — VITAMIN D 25 HYDROXY (VIT D DEFICIENCY, FRACTURES): VITD: 48.64 ng/mL (ref 30.00–100.00)

## 2016-09-29 MED ORDER — ZOSTER VACCINE LIVE 19400 UNT/0.65ML ~~LOC~~ SUSR: 0.6500 mL | Freq: Once | SUBCUTANEOUS | 0 refills | Status: AC

## 2016-09-29 MED ORDER — ZOSTER VAC RECOMB ADJUVANTED 50 MCG/0.5ML IM SUSR: 0.5000 mL | Freq: Once | INTRAMUSCULAR | 1 refills | Status: AC

## 2016-09-29 NOTE — Patient Instructions (Addendum)
Taking an antibiotic can create an imbalance in the normal population of bacteria that live in the small intestine.  This imbalance can persist for 3 months.   Taking a probiotic ( Align, Floraque or Culturelle or Digestive Advantage), the generic version of one of these over the counter medications, or an alternative form (kombucha,  Yogurt, or Kevita beverage) for a minimum of 3 weeks may help prevent a serious antibiotic associated diarrhea  Called clostridium dificile colitis that occurs when the bacteria population is altered .  Taking a probiotic may also prevent vaginitis due to yeast infections and can be continued indefinitely if you feel that it improves your digestion or your elimination (bowels).    There are 2 shingles vaccines.  The Shingrix is more effective in preventing shingles,.  You can get both  You received the TDap vaccine today    Health Maintenance for Postmenopausal Women Menopause is a normal process in which your reproductive ability comes to an end. This process happens gradually over a span of months to years, usually between the ages of 68 and 92. Menopause is complete when you have missed 12 consecutive menstrual periods. It is important to talk with your health care provider about some of the most common conditions that affect postmenopausal women, such as heart disease, cancer, and bone loss (osteoporosis). Adopting a healthy lifestyle and getting preventive care can help to promote your health and wellness. Those actions can also lower your chances of developing some of these common conditions. What should I know about menopause? During menopause, you may experience a number of symptoms, such as:  Moderate-to-severe hot flashes.  Night sweats.  Decrease in sex drive.  Mood swings.  Headaches.  Tiredness.  Irritability.  Memory problems.  Insomnia.  Choosing to treat or not to treat menopausal changes is an individual decision that you make with your  health care provider. What should I know about hormone replacement therapy and supplements? Hormone therapy products are effective for treating symptoms that are associated with menopause, such as hot flashes and night sweats. Hormone replacement carries certain risks, especially as you become older. If you are thinking about using estrogen or estrogen with progestin treatments, discuss the benefits and risks with your health care provider. What should I know about heart disease and stroke? Heart disease, heart attack, and stroke become more likely as you age. This may be due, in part, to the hormonal changes that your body experiences during menopause. These can affect how your body processes dietary fats, triglycerides, and cholesterol. Heart attack and stroke are both medical emergencies. There are many things that you can do to help prevent heart disease and stroke:  Have your blood pressure checked at least every 1-2 years. High blood pressure causes heart disease and increases the risk of stroke.  If you are 16-73 years old, ask your health care provider if you should take aspirin to prevent a heart attack or a stroke.  Do not use any tobacco products, including cigarettes, chewing tobacco, or electronic cigarettes. If you need help quitting, ask your health care provider.  It is important to eat a healthy diet and maintain a healthy weight. ? Be sure to include plenty of vegetables, fruits, low-fat dairy products, and lean protein. ? Avoid eating foods that are high in solid fats, added sugars, or salt (sodium).  Get regular exercise. This is one of the most important things that you can do for your health. ? Try to exercise for at  least 150 minutes each week. The type of exercise that you do should increase your heart rate and make you sweat. This is known as moderate-intensity exercise. ? Try to do strengthening exercises at least twice each week. Do these in addition to the  moderate-intensity exercise.  Know your numbers.Ask your health care provider to check your cholesterol and your blood glucose. Continue to have your blood tested as directed by your health care provider.  What should I know about cancer screening? There are several types of cancer. Take the following steps to reduce your risk and to catch any cancer development as early as possible. Breast Cancer  Practice breast self-awareness. ? This means understanding how your breasts normally appear and feel. ? It also means doing regular breast self-exams. Let your health care provider know about any changes, no matter how small.  If you are 68 or older, have a clinician do a breast exam (clinical breast exam or CBE) every year. Depending on your age, family history, and medical history, it may be recommended that you also have a yearly breast X-ray (mammogram).  If you have a family history of breast cancer, talk with your health care provider about genetic screening.  If you are at high risk for breast cancer, talk with your health care provider about having an MRI and a mammogram every year.  Breast cancer (BRCA) gene test is recommended for women who have family members with BRCA-related cancers. Results of the assessment will determine the need for genetic counseling and BRCA1 and for BRCA2 testing. BRCA-related cancers include these types: ? Breast. This occurs in males or females. ? Ovarian. ? Tubal. This may also be called fallopian tube cancer. ? Cancer of the abdominal or pelvic lining (peritoneal cancer). ? Prostate. ? Pancreatic.  Cervical, Uterine, and Ovarian Cancer Your health care provider may recommend that you be screened regularly for cancer of the pelvic organs. These include your ovaries, uterus, and vagina. This screening involves a pelvic exam, which includes checking for microscopic changes to the surface of your cervix (Pap test).  For women ages 21-65, health care  providers may recommend a pelvic exam and a Pap test every three years. For women ages 90-65, they may recommend the Pap test and pelvic exam, combined with testing for human papilloma virus (HPV), every five years. Some types of HPV increase your risk of cervical cancer. Testing for HPV may also be done on women of any age who have unclear Pap test results.  Other health care providers may not recommend any screening for nonpregnant women who are considered low risk for pelvic cancer and have no symptoms. Ask your health care provider if a screening pelvic exam is right for you.  If you have had past treatment for cervical cancer or a condition that could lead to cancer, you need Pap tests and screening for cancer for at least 20 years after your treatment. If Pap tests have been discontinued for you, your risk factors (such as having a new sexual partner) need to be reassessed to determine if you should start having screenings again. Some women have medical problems that increase the chance of getting cervical cancer. In these cases, your health care provider may recommend that you have screening and Pap tests more often.  If you have a family history of uterine cancer or ovarian cancer, talk with your health care provider about genetic screening.  If you have vaginal bleeding after reaching menopause, tell your health care provider.  There are currently no reliable tests available to screen for ovarian cancer.  Lung Cancer Lung cancer screening is recommended for adults 30-76 years old who are at high risk for lung cancer because of a history of smoking. A yearly low-dose CT scan of the lungs is recommended if you:  Currently smoke.  Have a history of at least 30 pack-years of smoking and you currently smoke or have quit within the past 15 years. A pack-year is smoking an average of one pack of cigarettes per day for one year.  Yearly screening should:  Continue until it has been 15 years  since you quit.  Stop if you develop a health problem that would prevent you from having lung cancer treatment.  Colorectal Cancer  This type of cancer can be detected and can often be prevented.  Routine colorectal cancer screening usually begins at age 45 and continues through age 36.  If you have risk factors for colon cancer, your health care provider may recommend that you be screened at an earlier age.  If you have a family history of colorectal cancer, talk with your health care provider about genetic screening.  Your health care provider may also recommend using home test kits to check for hidden blood in your stool.  A small camera at the end of a tube can be used to examine your colon directly (sigmoidoscopy or colonoscopy). This is done to check for the earliest forms of colorectal cancer.  Direct examination of the colon should be repeated every 5-10 years until age 26. However, if early forms of precancerous polyps or small growths are found or if you have a family history or genetic risk for colorectal cancer, you may need to be screened more often.  Skin Cancer  Check your skin from head to toe regularly.  Monitor any moles. Be sure to tell your health care provider: ? About any new moles or changes in moles, especially if there is a change in a mole's shape or color. ? If you have a mole that is larger than the size of a pencil eraser.  If any of your family members has a history of skin cancer, especially at a young age, talk with your health care provider about genetic screening.  Always use sunscreen. Apply sunscreen liberally and repeatedly throughout the day.  Whenever you are outside, protect yourself by wearing long sleeves, pants, a wide-brimmed hat, and sunglasses.  What should I know about osteoporosis? Osteoporosis is a condition in which bone destruction happens more quickly than new bone creation. After menopause, you may be at an increased risk for  osteoporosis. To help prevent osteoporosis or the bone fractures that can happen because of osteoporosis, the following is recommended:  If you are 4-15 years old, get at least 1,000 mg of calcium and at least 600 mg of vitamin D per day.  If you are older than age 48 but younger than age 64, get at least 1,200 mg of calcium and at least 600 mg of vitamin D per day.  If you are older than age 39, get at least 1,200 mg of calcium and at least 800 mg of vitamin D per day.  Smoking and excessive alcohol intake increase the risk of osteoporosis. Eat foods that are rich in calcium and vitamin D, and do weight-bearing exercises several times each week as directed by your health care provider. What should I know about how menopause affects my mental health? Depression may occur at any  age, but it is more common as you become older. Common symptoms of depression include:  Low or sad mood.  Changes in sleep patterns.  Changes in appetite or eating patterns.  Feeling an overall lack of motivation or enjoyment of activities that you previously enjoyed.  Frequent crying spells.  Talk with your health care provider if you think that you are experiencing depression. What should I know about immunizations? It is important that you get and maintain your immunizations. These include:  Tetanus, diphtheria, and pertussis (Tdap) booster vaccine.  Influenza every year before the flu season begins.  Pneumonia vaccine.  Shingles vaccine.  Your health care provider may also recommend other immunizations. This information is not intended to replace advice given to you by your health care provider. Make sure you discuss any questions you have with your health care provider. Document Released: 02/26/2005 Document Revised: 07/25/2015 Document Reviewed: 10/08/2014 Elsevier Interactive Patient Education  2018 [employer].

## 2016-09-29 NOTE — Progress Notes (Signed)
hoistory Patient ID: Karen Odom, female    DOB: Jun 08, 1955  Age: 61 y.o. MRN: [id number]  The patient is here for annual preventive examination and management of other chronic and acute problems.  Due for tdap, pap smear,   Colon due 2019 Mammogram due nov 2018 DEXA 2017 OSTEOPENIA RECEIVED FLU VACCINE     The risk factors are reflected in the social history.  The roster of all physicians providing medical care to patient - is listed in the Snapshot section of the chart.  Activities of daily living:  The patient is 100% independent in all ADLs: dressing, toileting, feeding as well as independent mobility  Home safety : The patient has smoke detectors in the home. They wear seatbelts.  There are no firearms at home. There is no violence in the home.   There is no risks for hepatitis, STDs or HIV. There is no   history of blood transfusion. They have no travel history to infectious disease endemic areas of the world.  The patient has seen their dentist in the last six month. They have seen their eye doctor in the last year. They admit to slight hearing difficulty with regard to whispered voices and some television programs.  They have deferred audiologic testing in the last year.  They do not  have excessive sun exposure. Discussed the need for sun protection: hats, long sleeves and use of sunscreen if there is significant sun exposure.   Diet: the importance of a healthy diet is discussed. They do have a healthy diet.  The benefits of regular aerobic exercise were discussed. She walks 4 times per week ,  20 minutes.   Depression screen: there are no signs or vegative symptoms of depression- irritability, change in appetite, anhedonia, sadness/tearfullness.  Cognitive assessment: the patient manages all their financial and personal affairs and is actively engaged. They could relate day,date,year and events; recalled 2/3 objects at 3 minutes; performed clock-face test normally.  The  following portions of the patient's history were reviewed and updated as appropriate: allergies, current medications, past family history, past medical history,  past surgical history, past social history  and problem list.  Visual acuity was not assessed per patient preference since she has regular follow up with her ophthalmologist. Hearing and body mass index were assessed and reviewed.   During the course of the visit the patient was educated and counseled about appropriate screening and preventive services including : fall prevention , diabetes screening, nutrition counseling, colorectal cancer screening, and recommended immunizations.    CC: The primary encounter diagnosis was Screening breast examination. Diagnoses of Screening for cervical cancer, Vitamin D deficiency, Fatigue, unspecified type, Hypertension, unspecified type, Pure hypercholesterolemia, Breast cancer screening, Abnormal female pelvic exam, Need for diphtheria-tetanus-pertussis (Tdap) vaccine, Visit for preventive health examination, Essential hypertension, benign, Vulvar lesion, Dental fistula, and History of corneal ulcer were also pertinent to this visit.  HAS NOTED A LABIAL GROWTH OF UNCLEAR CHRONICITIY,  HAS NOT SHAVED SINCE MAY.  NOT DRAINING, .  NON TENDER     Had a dental fistula last year , left maxilla,  Had removal  OF CROWN,  BONE GRAFT,  [facility] TO BE DONE IN 3  Months,  Treated with amox 500 mg tid x 10 days,  No probiotic,  No diarrhea .   20 years ago had a root canal.   Treated for corneal ulcer  Left eye  Has suspended contact use for several weeks.  History Lynden has a past medical history of Chicken pox; HPV test positive (2004); Hyperlipidemia; and Hypertension.   She has a past surgical history that includes Abdominal hysterectomy and Breast biopsy (Bilateral).   Her family history includes Alcohol abuse in her father; Diabetes in her father; Early death in her father; Heart disease in her  father; Hyperlipidemia in her father; Hypertension in her father.She reports that she quit smoking about 25 years ago. Her smoking use included Cigarettes. She smoked 1.00 pack per day. She has never used smokeless tobacco. She reports that she drinks alcohol. She reports that she does not use drugs.  Outpatient Medications Prior to Visit  Medication Sig Dispense Refill  . lisinopril (PRINIVIL,ZESTRIL) 10 MG tablet TAKE 1 TABLET BY MOUTH DAILY. 90 tablet 1   No facility-administered medications prior to visit.     Review of Systems   Patient denies headache, fevers, malaise, unintentional weight loss, skin rash, eye pain, sinus congestion and sinus pain, sore throat, dysphagia,  hemoptysis , cough, dyspnea, wheezing, chest pain, palpitations, orthopnea, edema, abdominal pain, nausea, melena, diarrhea, constipation, flank pain, dysuria, hematuria, urinary  Frequency, nocturia, numbness, tingling, seizures,  Focal weakness, Loss of consciousness,  Tremor, insomnia, depression, anxiety, and suicidal ideation.     Objective:  BP 126/82 (BP Location: Left Arm, Patient Position: Sitting, Cuff Size: Normal)   Pulse 60   Temp 98.1 F (36.7 C) (Oral)   Resp 15   Ht 5' 5.75" (1.67 m)   Wt 138 lb 6.4 oz (62.8 kg)   SpO2 95%   BMI 22.51 kg/m   Physical Exam   General Appearance:    Alert, cooperative, no distress, appears stated age  Head:    Normocephalic, without obvious abnormality, atraumatic  Eyes:    PERRL, conjunctiva/corneas clear, EOM's intact, fundi    benign, both eyes  Ears:    Normal TM's and external ear canals, both ears  Nose:   Nares normal, septum midline, mucosa normal, no drainage    or sinus tenderness  Throat:   Lips, mucosa, and tongue normal; teeth and gums normal  Neck:   Supple, symmetrical, trachea midline, no adenopathy;    thyroid:  no enlargement/tenderness/nodules; no carotid   bruit or JVD  Back:     Symmetric, no curvature, ROM normal, no CVA tenderness   Lungs:     Clear to auscultation bilaterally, respirations unlabored  Chest Wall:    No tenderness or deformity   Heart:    Regular rate and rhythm, S1 and S2 normal, no murmur, rub   or gallop  Breast Exam:    No tenderness, masses, or nipple abnormality  Abdomen:     Soft, non-tender, bowel sounds active all four quadrants,    no masses, no organomegaly  Genitalia:    Pelvic: cervix normal in appearance, external genitalia notable for hyperpigmented macules covering the prepuce , no adnexal masses or tenderness, no cervical motion tenderness, rectovaginal septum normal, uterus normal size, shape, and consistency and vagina normal without discharge  Extremities:   Extremities normal, atraumatic, no cyanosis or edema  Pulses:   2+ and symmetric all extremities  Skin:   Skin color, texture, turgor normal, no rashes or lesions  Lymph nodes:   Cervical, supraclavicular, and axillary nodes normal  Neurologic:   CNII-XII intact, normal strength, sensation and reflexes    throughout      Assessment & Plan:   Problem List Items Addressed This Visit    Dental fistula  Treated to resolution with antibiotics and bone graft by  oral surgery  Advised to use probiotics in the future       Essential hypertension, benign    Well controlled on current regimen. Renal function stable, no changes today.  Lab Results  Component Value Date   CREATININE 0.65 09/29/2016   Lab Results  Component Value Date   NA 135 09/29/2016   K 4.4 09/29/2016   CL 98 09/29/2016   CO2 26 09/29/2016         History of corneal ulcer    Left eye..  Secondary to prolonged wearing of contact lenses.       Screening for cervical cancer    PAP smear was done today. She is at average risk       Relevant Orders   Cytology - PAP (Completed)   Visit for preventive health examination    Annual comprehensive preventive exam was done as well as an evaluation and management of chronic conditions .  During the  course of the visit the patient was educated and counseled about appropriate screening and preventive services including :  diabetes screening, lipid analysis with projected  10 year  risk for CAD , nutrition counseling, breast, cervical and colorectal cancer screening, and recommended immunizations.  Printed recommendations for health maintenance screenings was given      Vulvar lesion    She has newly noticed hyperpigmented macular lesions covering her prepuce. Referring to gYN for biopsy to rule out HGSIL        Other Visit Diagnoses    Screening breast examination    -  Primary   Vitamin D deficiency       Relevant Orders   VITAMIN D 25 Hydroxy (Vit-D Deficiency, Fractures) (Completed)   Fatigue, unspecified type       Relevant Orders   TSH (Completed)   Hypertension, unspecified type       Relevant Orders   Comprehensive metabolic panel (Completed)   Urine Microalbumin w/creat. ratio (Completed)   Pure hypercholesterolemia       Relevant Orders   Lipid panel (Completed)   Breast cancer screening       Relevant Orders   MM SCREENING BREAST TOMO BILATERAL   Abnormal female pelvic exam       Relevant Orders   Ambulatory referral to Gynecology   Need for diphtheria-tetanus-pertussis (Tdap) vaccine       Relevant Orders   Tdap vaccine greater than or equal to 7yo IM (Completed)      I am having Ms. Inabinet start on Zoster Vac Recomb Adjuvanted. I am also having her maintain her lisinopril.  Meds ordered this encounter  Medications  . Zoster Vaccine Live, PF, (ZOSTAVAX) 84166 UNT/0.65ML injection    Sig: Inject 19,400 Units into the skin once.    Dispense:  1 each    Refill:  0  . Zoster Vac Recomb Adjuvanted (SHINGRIX) injection    Sig: Inject 0.5 mLs into the muscle once.    Dispense:  1 each    Refill:  1    Medications Discontinued During This Encounter  Medication Reason  . Zoster Vaccine Live, PF, (ZOSTAVAX) 06301 UNT/0.65ML injection Reorder    Follow-up: No  Follow-up on file.   Crecencio Mc, MD

## 2016-10-01 DIAGNOSIS — Z8669 Personal history of other diseases of the nervous system and sense organs: Secondary | ICD-10-CM | POA: Insufficient documentation

## 2016-10-01 DIAGNOSIS — K046 Periapical abscess with sinus: Secondary | ICD-10-CM

## 2016-10-01 DIAGNOSIS — N9089 Other specified noninflammatory disorders of vulva and perineum: Secondary | ICD-10-CM | POA: Insufficient documentation

## 2016-10-01 HISTORY — DX: Periapical abscess with sinus: K04.6

## 2016-10-01 LAB — CYTOLOGY - PAP: HPV: NOT DETECTED

## 2016-10-01 NOTE — Assessment & Plan Note (Signed)
Treated to resolution with antibiotics and bone graft by  oral surgery  Advised to use probiotics in the future

## 2016-10-01 NOTE — Assessment & Plan Note (Signed)
Well controlled on current regimen. Renal function stable, no changes today.  Lab Results  Component Value Date   CREATININE 0.65 09/29/2016   Lab Results  Component Value Date   NA 135 09/29/2016   K 4.4 09/29/2016   CL 98 09/29/2016   CO2 26 09/29/2016

## 2016-10-01 NOTE — Assessment & Plan Note (Signed)
Left eye..  Secondary to prolonged wearing of contact lenses.

## 2016-10-01 NOTE — Assessment & Plan Note (Signed)
She has newly noticed hyperpigmented macular lesions covering her prepuce. Referring to gYN for biopsy to rule out HGSIL

## 2016-10-01 NOTE — Assessment & Plan Note (Signed)
Annual comprehensive preventive exam was done as well as an evaluation and management of chronic conditions .  During the course of the visit the patient was educated and counseled about appropriate screening and preventive services including :  diabetes screening, lipid analysis with projected  10 year  risk for CAD , nutrition counseling, breast, cervical and colorectal cancer screening, and recommended immunizations.  Printed recommendations for health maintenance screenings was given 

## 2016-10-01 NOTE — Assessment & Plan Note (Signed)
PAP smear was done today. She is at average risk

## 2016-10-12 ENCOUNTER — Ambulatory Visit: Payer: Self-pay

## 2016-11-04 ENCOUNTER — Encounter: Payer: Self-pay | Admitting: Internal Medicine

## 2016-11-12 ENCOUNTER — Ambulatory Visit
Admission: RE | Admit: 2016-11-12 | Discharge: 2016-11-12 | Disposition: A | Discharge status: Home / Self Care | Payer: 59 | Source: Ambulatory Visit | Attending: Internal Medicine | Admitting: Internal Medicine

## 2016-11-12 ENCOUNTER — Telehealth: Payer: Self-pay | Admitting: Gastroenterology

## 2016-11-12 ENCOUNTER — Other Ambulatory Visit: Payer: Self-pay

## 2016-11-12 ENCOUNTER — Encounter: Payer: Self-pay | Admitting: Internal Medicine

## 2016-11-12 DIAGNOSIS — Z1212 Encounter for screening for malignant neoplasm of rectum: Principal | ICD-10-CM

## 2016-11-12 DIAGNOSIS — M8580 Other specified disorders of bone density and structure, unspecified site: Secondary | ICD-10-CM

## 2016-11-12 DIAGNOSIS — Z1231 Encounter for screening mammogram for malignant neoplasm of breast: Secondary | ICD-10-CM | POA: Insufficient documentation

## 2016-11-12 DIAGNOSIS — Z1211 Encounter for screening for malignant neoplasm of colon: Secondary | ICD-10-CM

## 2016-11-12 DIAGNOSIS — Z1239 Encounter for other screening for malignant neoplasm of breast: Secondary | ICD-10-CM

## 2016-11-12 NOTE — Telephone Encounter (Signed)
Gastroenterology Pre-Procedure Review  Request Date: Tuesday, 01/24/17 Requesting Physician: Dr. Allen Norris  PATIENT REVIEW QUESTIONS: The patient responded to the following health history questions as indicated:    1. Are you having any GI issues? no 2. Do you have a personal history of Polyps? no 3. Do you have a family history of Colon Cancer or Polyps? Mother: polyps 4. Diabetes Mellitus? no 5. Joint replacements in the past 12 months?no 6. Major health problems in the past 3 months?no 7. Any artificial heart valves, MVP, or defibrillator?no    MEDICATIONS & ALLERGIES:    Patient reports the following regarding taking any anticoagulation/antiplatelet therapy:   Plavix, Coumadin, Eliquis, Xarelto, Lovenox, Pradaxa, Brilinta, or Effient? no Aspirin? no  Patient confirms/reports the following medications:  Current Outpatient Prescriptions  Medication Sig Dispense Refill  . lisinopril (PRINIVIL,ZESTRIL) 10 MG tablet TAKE 1 TABLET BY MOUTH DAILY. 90 tablet 1   No current facility-administered medications for this visit.     Patient confirms/reports the following allergies:  No Known Allergies  No orders of the defined types were placed in this encounter.   AUTHORIZATION INFORMATION Primary Insurance: 1D#: Group #:  Secondary Insurance: 1D#: Group #:  SCHEDULE INFORMATION: Date: 01/24/17 Time: Location: [place]

## 2016-11-12 NOTE — Telephone Encounter (Signed)
Patient getting messages from [facility] to schedule a colonoscopy. Please call patient. Per patient she is Dr. Dorothey Baseman patient.

## 2016-12-17 DIAGNOSIS — H5203 Hypermetropia, bilateral: Secondary | ICD-10-CM | POA: Diagnosis not present

## 2016-12-21 ENCOUNTER — Ambulatory Visit
Admission: RE | Admit: 2016-12-21 | Discharge: 2016-12-21 | Disposition: A | Discharge status: Home / Self Care | Payer: 59 | Source: Ambulatory Visit | Attending: Internal Medicine | Admitting: Internal Medicine

## 2016-12-21 DIAGNOSIS — M8588 Other specified disorders of bone density and structure, other site: Secondary | ICD-10-CM | POA: Insufficient documentation

## 2016-12-21 DIAGNOSIS — M8589 Other specified disorders of bone density and structure, multiple sites: Secondary | ICD-10-CM | POA: Diagnosis not present

## 2016-12-21 DIAGNOSIS — M8580 Other specified disorders of bone density and structure, unspecified site: Secondary | ICD-10-CM | POA: Diagnosis present

## 2016-12-22 ENCOUNTER — Encounter: Payer: Self-pay | Admitting: Internal Medicine

## 2017-01-19 DIAGNOSIS — L900 Lichen sclerosus et atrophicus: Secondary | ICD-10-CM | POA: Diagnosis not present

## 2017-01-19 DIAGNOSIS — N9089 Other specified noninflammatory disorders of vulva and perineum: Secondary | ICD-10-CM | POA: Diagnosis not present

## 2017-01-24 ENCOUNTER — Encounter: Payer: Self-pay | Admitting: Internal Medicine

## 2017-01-24 ENCOUNTER — Ambulatory Visit: Admit: 2017-01-24 | Payer: 59 | Admitting: Gastroenterology

## 2017-01-24 SURGERY — COLONOSCOPY WITH PROPOFOL
Anesthesia: Choice

## 2017-03-14 ENCOUNTER — Other Ambulatory Visit: Payer: Self-pay | Admitting: Internal Medicine

## 2017-08-06 ENCOUNTER — Encounter: Payer: Self-pay | Admitting: Internal Medicine

## 2017-09-29 ENCOUNTER — Other Ambulatory Visit: Payer: Self-pay | Admitting: Internal Medicine

## 2017-09-29 DIAGNOSIS — Z1231 Encounter for screening mammogram for malignant neoplasm of breast: Secondary | ICD-10-CM

## 2017-09-30 ENCOUNTER — Ambulatory Visit (INDEPENDENT_AMBULATORY_CARE_PROVIDER_SITE_OTHER): Payer: 59 | Admitting: Internal Medicine

## 2017-09-30 ENCOUNTER — Encounter: Payer: Self-pay | Admitting: Internal Medicine

## 2017-09-30 VITALS — BP 110/76 | HR 51 | Temp 98.0°F | Resp 14 | Ht 65.75 in | Wt 136.4 lb

## 2017-09-30 DIAGNOSIS — N904 Leukoplakia of vulva: Secondary | ICD-10-CM | POA: Diagnosis not present

## 2017-09-30 DIAGNOSIS — Z0000 Encounter for general adult medical examination without abnormal findings: Secondary | ICD-10-CM

## 2017-09-30 DIAGNOSIS — I10 Essential (primary) hypertension: Secondary | ICD-10-CM | POA: Diagnosis not present

## 2017-09-30 DIAGNOSIS — Z1211 Encounter for screening for malignant neoplasm of colon: Secondary | ICD-10-CM

## 2017-09-30 DIAGNOSIS — N9089 Other specified noninflammatory disorders of vulva and perineum: Secondary | ICD-10-CM | POA: Diagnosis not present

## 2017-09-30 DIAGNOSIS — L578 Other skin changes due to chronic exposure to nonionizing radiation: Secondary | ICD-10-CM | POA: Diagnosis not present

## 2017-09-30 LAB — COMPREHENSIVE METABOLIC PANEL
ALBUMIN: 4.4 g/dL (ref 3.5–5.2)
ALT: 20 U/L (ref 0–35)
AST: 18 U/L (ref 0–37)
Alkaline Phosphatase: 42 U/L (ref 39–117)
BILIRUBIN TOTAL: 0.5 mg/dL (ref 0.2–1.2)
BUN: 23 mg/dL (ref 6–23)
CALCIUM: 9.6 mg/dL (ref 8.4–10.5)
CHLORIDE: 101 meq/L (ref 96–112)
CO2: 26 mEq/L (ref 19–32)
CREATININE: 0.74 mg/dL (ref 0.40–1.20)
GFR: 84.69 mL/min (ref >60.00)
Glucose, Bld: 90 mg/dL (ref 70–99)
POTASSIUM: 4.2 meq/L (ref 3.5–5.1)
Sodium: 137 mEq/L (ref 135–145)
Total Protein: 7.1 g/dL (ref 6.0–8.3)

## 2017-09-30 NOTE — Patient Instructions (Signed)
I have placed referrals to Dr Allen Norris and Dr Phillip Heal  We will repeat your DEXA scan NEXT year (not needed more than every 2 years)  PAP SMEAR is due in 2021  DO NOT Dowell!!   Health Maintenance for Postmenopausal Women Menopause is a normal process in which your reproductive ability comes to an end. This process happens gradually over a span of months to years, usually between the ages of 2 and 61. Menopause is complete when you have missed 12 consecutive menstrual periods. It is important to talk with your health care provider about some of the most common conditions that affect postmenopausal women, such as heart disease, cancer, and bone loss (osteoporosis). Adopting a healthy lifestyle and getting preventive care can help to promote your health and wellness. Those actions can also lower your chances of developing some of these common conditions. What should I know about menopause? During menopause, you may experience a number of symptoms, such as:  Moderate-to-severe hot flashes.  Night sweats.  Decrease in sex drive.  Mood swings.  Headaches.  Tiredness.  Irritability.  Memory problems.  Insomnia.  Choosing to treat or not to treat menopausal changes is an individual decision that you make with your health care provider. What should I know about hormone replacement therapy and supplements? Hormone therapy products are effective for treating symptoms that are associated with menopause, such as hot flashes and night sweats. Hormone replacement carries certain risks, especially as you become older. If you are thinking about using estrogen or estrogen with progestin treatments, discuss the benefits and risks with your health care provider. What should I know about heart disease and stroke? Heart disease, heart attack, and stroke become more likely as you age. This may be due, in part, to the hormonal changes that your body experiences during menopause. These can  affect how your body processes dietary fats, triglycerides, and cholesterol. Heart attack and stroke are both medical emergencies. There are many things that you can do to help prevent heart disease and stroke:  Have your blood pressure checked at least every 1-2 years. High blood pressure causes heart disease and increases the risk of stroke.  If you are 64-71 years old, ask your health care provider if you should take aspirin to prevent a heart attack or a stroke.  Do not use any tobacco products, including cigarettes, chewing tobacco, or electronic cigarettes. If you need help quitting, ask your health care provider.  It is important to eat a healthy diet and maintain a healthy weight. ? Be sure to include plenty of vegetables, fruits, low-fat dairy products, and lean protein. ? Avoid eating foods that are high in solid fats, added sugars, or salt (sodium).  Get regular exercise. This is one of the most important things that you can do for your health. ? Try to exercise for at least 150 minutes each week. The type of exercise that you do should increase your heart rate and make you sweat. This is known as moderate-intensity exercise. ? Try to do strengthening exercises at least twice each week. Do these in addition to the moderate-intensity exercise.  Know your numbers.Ask your health care provider to check your cholesterol and your blood glucose. Continue to have your blood tested as directed by your health care provider.  What should I know about cancer screening? There are several types of cancer. Take the following steps to reduce your risk and to catch any cancer development as early as possible. Breast  Cancer  Practice breast self-awareness. ? This means understanding how your breasts normally appear and feel. ? It also means doing regular breast self-exams. Let your health care provider know about any changes, no matter how small.  If you are 72 or older, have a clinician do a  breast exam (clinical breast exam or CBE) every year. Depending on your age, family history, and medical history, it may be recommended that you also have a yearly breast X-ray (mammogram).  If you have a family history of breast cancer, talk with your health care provider about genetic screening.  If you are at high risk for breast cancer, talk with your health care provider about having an MRI and a mammogram every year.  Breast cancer (BRCA) gene test is recommended for women who have family members with BRCA-related cancers. Results of the assessment will determine the need for genetic counseling and BRCA1 and for BRCA2 testing. BRCA-related cancers include these types: ? Breast. This occurs in males or females. ? Ovarian. ? Tubal. This may also be called fallopian tube cancer. ? Cancer of the abdominal or pelvic lining (peritoneal cancer). ? Prostate. ? Pancreatic.  Cervical, Uterine, and Ovarian Cancer Your health care provider may recommend that you be screened regularly for cancer of the pelvic organs. These include your ovaries, uterus, and vagina. This screening involves a pelvic exam, which includes checking for microscopic changes to the surface of your cervix (Pap test).  For women ages 21-65, health care providers may recommend a pelvic exam and a Pap test every three years. For women ages 71-65, they may recommend the Pap test and pelvic exam, combined with testing for human papilloma virus (HPV), every five years. Some types of HPV increase your risk of cervical cancer. Testing for HPV may also be done on women of any age who have unclear Pap test results.  Other health care providers may not recommend any screening for nonpregnant women who are considered low risk for pelvic cancer and have no symptoms. Ask your health care provider if a screening pelvic exam is right for you.  If you have had past treatment for cervical cancer or a condition that could lead to cancer, you need  Pap tests and screening for cancer for at least 20 years after your treatment. If Pap tests have been discontinued for you, your risk factors (such as having a new sexual partner) need to be reassessed to determine if you should start having screenings again. Some women have medical problems that increase the chance of getting cervical cancer. In these cases, your health care provider may recommend that you have screening and Pap tests more often.  If you have a family history of uterine cancer or ovarian cancer, talk with your health care provider about genetic screening.  If you have vaginal bleeding after reaching menopause, tell your health care provider.  There are currently no reliable tests available to screen for ovarian cancer.  Lung Cancer Lung cancer screening is recommended for adults 41-96 years old who are at high risk for lung cancer because of a history of smoking. A yearly low-dose CT scan of the lungs is recommended if you:  Currently smoke.  Have a history of at least 30 pack-years of smoking and you currently smoke or have quit within the past 15 years. A pack-year is smoking an average of one pack of cigarettes per day for one year.  Yearly screening should:  Continue until it has been 15 years since you  quit.  Stop if you develop a health problem that would prevent you from having lung cancer treatment.  Colorectal Cancer  This type of cancer can be detected and can often be prevented.  Routine colorectal cancer screening usually begins at age 62 and continues through age 93.  If you have risk factors for colon cancer, your health care provider may recommend that you be screened at an earlier age.  If you have a family history of colorectal cancer, talk with your health care provider about genetic screening.  Your health care provider may also recommend using home test kits to check for hidden blood in your stool.  A small camera at the end of a tube can be used  to examine your colon directly (sigmoidoscopy or colonoscopy). This is done to check for the earliest forms of colorectal cancer.  Direct examination of the colon should be repeated every 5-10 years until age 61. However, if early forms of precancerous polyps or small growths are found or if you have a family history or genetic risk for colorectal cancer, you may need to be screened more often.  Skin Cancer  Check your skin from head to toe regularly.  Monitor any moles. Be sure to tell your health care provider: ? About any new moles or changes in moles, especially if there is a change in a mole's shape or color. ? If you have a mole that is larger than the size of a pencil eraser.  If any of your family members has a history of skin cancer, especially at a young age, talk with your health care provider about genetic screening.  Always use sunscreen. Apply sunscreen liberally and repeatedly throughout the day.  Whenever you are outside, protect yourself by wearing long sleeves, pants, a wide-brimmed hat, and sunglasses.  What should I know about osteoporosis? Osteoporosis is a condition in which bone destruction happens more quickly than new bone creation. After menopause, you may be at an increased risk for osteoporosis. To help prevent osteoporosis or the bone fractures that can happen because of osteoporosis, the following is recommended:  If you are 35-4 years old, get at least 1,000 mg of calcium and at least 600 mg of vitamin D per day.  If you are older than age 31 but younger than age 13, get at least 1,200 mg of calcium and at least 600 mg of vitamin D per day.  If you are older than age 58, get at least 1,200 mg of calcium and at least 800 mg of vitamin D per day.  Smoking and excessive alcohol intake increase the risk of osteoporosis. Eat foods that are rich in calcium and vitamin D, and do weight-bearing exercises several times each week as directed by your health care  provider. What should I know about how menopause affects my mental health? Depression may occur at any age, but it is more common as you become older. Common symptoms of depression include:  Low or sad mood.  Changes in sleep patterns.  Changes in appetite or eating patterns.  Feeling an overall lack of motivation or enjoyment of activities that you previously enjoyed.  Frequent crying spells.  Talk with your health care provider if you think that you are experiencing depression. What should I know about immunizations? It is important that you get and maintain your immunizations. These include:  Tetanus, diphtheria, and pertussis (Tdap) booster vaccine.  Influenza every year before the flu season begins.  Pneumonia vaccine.  Shingles vaccine.  Your health care provider may also recommend other immunizations. This information is not intended to replace advice given to you by your health care provider. Make sure you discuss any questions you have with your health care provider. Document Released: 02/26/2005 Document Revised: 07/25/2015 Document Reviewed: 10/08/2014 Elsevier Interactive Patient Education  2018 [employer].

## 2017-09-30 NOTE — Progress Notes (Signed)
Patient ID: Karen Odom, female    DOB: [birth year]  Age: 62 y.o. MRN: [id number]  The patient is here for annual preventive  examination and management of other chronic and acute problems.   PAP ATROPHY 2018 COLONOSCOPY OR COLOGUARD DUE MAMMOGRAM DUE AFTER October 26 .  T SCORE -2.1 FEMUR RIGHT  DEXA  DEC 2018   The risk factors are reflected in the social history.  The roster of all physicians providing medical care to patient - is listed in the Snapshot section of the chart.  Activities of daily living:  The patient is 100% independent in all ADLs: dressing, toileting, feeding as well as independent mobility  Home safety : The patient has smoke detectors in the home. They wear seatbelts.  There are no firearms at home. There is no violence in the home.   There is no risks for hepatitis, STDs or HIV. There is no   history of blood transfusion. They have no travel history to infectious disease endemic areas of the world.  The patient has seen their dentist in the last six month. They have seen their eye doctor in the last year. They deny any hearing difficulty with regard to whispered voices or  television programs.  They have deferred audiologic testing in the last year.  They do not  have excessive sun exposure. Discussed the need for sun protection: hats, long sleeves and use of sunscreen if there is significant sun exposure.   Diet: the importance of a healthy diet is discussed. They do have a healthy diet.  The benefits of regular aerobic exercise were discussed. She exercises 3 to 4 days per week and uses a trainer at [facility] .    Depression screen: there are no signs or vegative symptoms of depression- irritability, change in appetite, anhedonia, sadness/tearfullness.  Cognitive assessment: the patient manages all their financial and personal affairs and is actively engaged. They could relate day,date,year and events; recalled 2/3 objects at 3 minutes; performed clock-face test  normally.  The following portions of the patient's history were reviewed and updated as appropriate: allergies, current medications, past family history, past medical history,  past surgical history, past social history  and problem list.  Visual acuity was not assessed per patient preference since she has regular follow up with her ophthalmologist. Hearing and body mass index were assessed and reviewed.   During the course of the visit the patient was educated and counseled about appropriate screening and preventive services including : fall prevention , diabetes screening, nutrition counseling, colorectal cancer screening, and recommended immunizations.    CC: The primary encounter diagnosis was Colon cancer screening. Diagnoses of Sun-damaged skin, Essential hypertension, benign, Visit for preventive health examination, Vulvar lesion, and Lichen sclerosus et atrophicus of the vulva were also pertinent to this visit.  History Ellah has a past medical history of Chicken pox, HPV test positive (2004), Hyperlipidemia, and Hypertension.   She has a past surgical history that includes Abdominal hysterectomy and Breast biopsy (Bilateral).   Her family history includes Alcohol abuse in her father; Diabetes in her father; Early death in her father; Heart disease in her father; Hyperlipidemia in her father; Hypertension in her father.She reports that she quit smoking about 26 years ago. Her smoking use included cigarettes. She smoked 1.00 pack per day. She has never used smokeless tobacco. She reports that she drinks alcohol. She reports that she does not use drugs.  Outpatient Medications Prior to Visit  Medication Sig Dispense Refill  .  lisinopril (PRINIVIL,ZESTRIL) 10 MG tablet TAKE 1 TABLET BY MOUTH DAILY. 90 tablet 1  . Zoster Vaccine Adjuvanted [facility]) injection INJ 0.5 ML IM UTD  1   No facility-administered medications prior to visit.     Review of Systems   Patient denies headache,  fevers, malaise, unintentional weight loss, skin rash, eye pain, sinus congestion and sinus pain, sore throat, dysphagia,  hemoptysis , cough, dyspnea, wheezing, chest pain, palpitations, orthopnea, edema, abdominal pain, nausea, melena, diarrhea, constipation, flank pain, dysuria, hematuria, urinary  Frequency, nocturia, numbness, tingling, seizures,  Focal weakness, Loss of consciousness,  Tremor, insomnia, depression, anxiety, and suicidal ideation.      Objective:  BP 110/76 (BP Location: Left Arm, Patient Position: Sitting, Cuff Size: Normal)   Pulse (!) 51   Temp 98 F (36.7 C) (Oral)   Resp 14   Ht 5' 5.75" (1.67 m)   Wt 136 lb 6.4 oz (61.9 kg)   SpO2 98%   BMI 22.18 kg/m   Physical Exam   General appearance: alert, cooperative and appears stated age Head: Normocephalic, without obvious abnormality, atraumatic Eyes: conjunctivae/corneas clear. PERRL, EOM's intact. Fundi benign. Ears: normal TM's and external ear canals both ears Nose: Nares normal. Septum midline. Mucosa normal. No drainage or sinus tenderness. Throat: lips, mucosa, and tongue normal; teeth and gums normal Neck: no adenopathy, no carotid bruit, no JVD, supple, symmetrical, trachea midline and thyroid not enlarged, symmetric, no tenderness/mass/nodules Lungs: clear to auscultation bilaterally Breasts: normal appearance, no masses or tenderness Heart: regular rate and rhythm, S1, S2 normal, no murmur, click, rub or gallop Abdomen: soft, non-tender; bowel sounds normal; no masses,  no organomegaly Extremities: extremities normal, atraumatic, no cyanosis or edema Pulses: 2+ and symmetric Skin: Skin color, texture, turgor normal. No rashes or lesions Neurologic: Alert and oriented X 3, normal strength and tone. Normal symmetric reflexes. Normal coordination and gait.      Assessment & Plan:   Problem List Items Addressed This Visit    Essential hypertension, benign    Well controlled on current regimen.  Renal function stable, no changes today.  Lab Results  Component Value Date   CREATININE 0.74 09/30/2017   Lab Results  Component Value Date   NA 137 09/30/2017   K 4.2 09/30/2017   CL 101 09/30/2017   CO2 26 09/30/2017         Relevant Orders   Comprehensive metabolic panel (Completed)   Lichen sclerosus et atrophicus of the vulva    Per biopsy 2018 [facility]. She has no itching .  Will defer use of steroids for now      Visit for preventive health examination    Annual comprehensive preventive exam was done as well as an evaluation and management of chronic conditions .  During the course of the visit the patient was educated and counseled about appropriate screening and preventive services including :  diabetes screening, lipid analysis with projected  10 year  risk for CAD , nutrition counseling, breast, cervical and colorectal cancer screening, and recommended immunizations.  Printed recommendations for health maintenance screenings was given.      Vulvar lesion    Other Visit Diagnoses    Colon cancer screening    -  Primary   Relevant Orders   Ambulatory referral to Gastroenterology   Sun-damaged skin       Relevant Orders   Ambulatory referral to Dermatology      I have discontinued Almyra Free T. Heckstall's Zoster Vaccine Adjuvanted. I  am also having her maintain her lisinopril.  No orders of the defined types were placed in this encounter.   Medications Discontinued During This Encounter  Medication Reason  . Zoster Vaccine Adjuvanted [facility]) injection Completed Course    Follow-up: Return in about 1 year (around 10/01/2018) for CPE.   Crecencio Mc, MD

## 2017-10-02 DIAGNOSIS — N904 Leukoplakia of vulva: Secondary | ICD-10-CM | POA: Insufficient documentation

## 2017-10-02 NOTE — Assessment & Plan Note (Signed)
Annual comprehensive preventive exam was done as well as an evaluation and management of chronic conditions .  During the course of the visit the patient was educated and counseled about appropriate screening and preventive services including :  diabetes screening, lipid analysis with projected  10 year  risk for CAD , nutrition counseling, breast, cervical and colorectal cancer screening, and recommended immunizations.  Printed recommendations for health maintenance screenings was given 

## 2017-10-02 NOTE — Assessment & Plan Note (Signed)
Well controlled on current regimen. Renal function stable, no changes today.  Lab Results  Component Value Date   CREATININE 0.74 09/30/2017   Lab Results  Component Value Date   NA 137 09/30/2017   K 4.2 09/30/2017   CL 101 09/30/2017   CO2 26 09/30/2017

## 2017-10-02 NOTE — Assessment & Plan Note (Signed)
Per biopsy 2018 [facility]. She has no itching .  Will defer use of steroids for now

## 2017-10-03 ENCOUNTER — Other Ambulatory Visit: Payer: Self-pay | Admitting: Internal Medicine

## 2017-10-03 ENCOUNTER — Other Ambulatory Visit: Payer: Self-pay

## 2017-10-03 DIAGNOSIS — Z1211 Encounter for screening for malignant neoplasm of colon: Secondary | ICD-10-CM

## 2017-10-03 MED ORDER — NA SULFATE-K SULFATE-MG SULF 17.5-3.13-1.6 GM/177ML PO SOLN: 1.0000 | Freq: Once | ORAL | 0 refills | Status: AC

## 2017-10-10 ENCOUNTER — Other Ambulatory Visit: Payer: Self-pay

## 2017-10-13 NOTE — Discharge Instructions (Signed)
General Anesthesia, Adult, Care After °These instructions provide you with information about caring for yourself after your procedure. Your health care provider may also give you more specific instructions. Your treatment has been planned according to current medical practices, but problems sometimes occur. Call your health care provider if you have any problems or questions after your procedure. °What can I expect after the procedure? °After the procedure, it is common to have: °· Vomiting. °· A sore throat. °· Mental slowness. ° °It is common to feel: °· Nauseous. °· Cold or shivery. °· Sleepy. °· Tired. °· Sore or achy, even in parts of your body where you did not have surgery. ° °Follow these instructions at home: °For at least 24 hours after the procedure: °· Do not: °? Participate in activities where you could fall or become injured. °? Drive. °? Use heavy machinery. °? Drink alcohol. °? Take sleeping pills or medicines that cause drowsiness. °? Make important decisions or sign legal documents. °? Take care of children on your own. °· Rest. °Eating and drinking °· If you vomit, drink water, juice, or soup when you can drink without vomiting. °· Drink enough fluid to keep your urine clear or pale yellow. °· Make sure you have little or no nausea before eating solid foods. °· Follow the diet recommended by your health care provider. °General instructions °· Have a responsible adult stay with you until you are awake and alert. °· Return to your normal activities as told by your health care provider. Ask your health care provider what activities are safe for you. °· Take over-the-counter and prescription medicines only as told by your health care provider. °· If you smoke, do not smoke without supervision. °· Keep all follow-up visits as told by your health care provider. This is important. °Contact a health care provider if: °· You continue to have nausea or vomiting at home, and medicines are not helpful. °· You  cannot drink fluids or start eating again. °· You cannot urinate after 8-12 hours. °· You develop a skin rash. °· You have fever. °· You have increasing redness at the site of your procedure. °Get help right away if: °· You have difficulty breathing. °· You have chest pain. °· You have unexpected bleeding. °· You feel that you are having a life-threatening or urgent problem. °This information is not intended to replace advice given to you by your health care provider. Make sure you discuss any questions you have with your health care provider. °Document Released: 04/12/2000 Document Revised: 06/09/2015 Document Reviewed: 12/19/2014 °Elsevier Interactive Patient Education © 2018 [employer] ° °

## 2017-10-14 ENCOUNTER — Ambulatory Visit: Payer: 59 | Admitting: Anesthesiology

## 2017-10-14 ENCOUNTER — Encounter
Admission: RE | Disposition: A | Discharge status: Home / Self Care | Payer: Self-pay | Source: Ambulatory Visit | Attending: Gastroenterology

## 2017-10-14 ENCOUNTER — Ambulatory Visit
Admission: RE | Admit: 2017-10-14 | Discharge: 2017-10-14 | Disposition: A | Discharge status: Home / Self Care | Payer: 59 | Source: Ambulatory Visit | Attending: Gastroenterology | Admitting: Gastroenterology

## 2017-10-14 DIAGNOSIS — Z833 Family history of diabetes mellitus: Secondary | ICD-10-CM | POA: Diagnosis not present

## 2017-10-14 DIAGNOSIS — Z811 Family history of alcohol abuse and dependence: Secondary | ICD-10-CM | POA: Insufficient documentation

## 2017-10-14 DIAGNOSIS — Z8249 Family history of ischemic heart disease and other diseases of the circulatory system: Secondary | ICD-10-CM | POA: Diagnosis not present

## 2017-10-14 DIAGNOSIS — I10 Essential (primary) hypertension: Secondary | ICD-10-CM | POA: Diagnosis not present

## 2017-10-14 DIAGNOSIS — E785 Hyperlipidemia, unspecified: Secondary | ICD-10-CM | POA: Insufficient documentation

## 2017-10-14 DIAGNOSIS — Z79899 Other long term (current) drug therapy: Secondary | ICD-10-CM | POA: Diagnosis not present

## 2017-10-14 DIAGNOSIS — Z1211 Encounter for screening for malignant neoplasm of colon: Secondary | ICD-10-CM | POA: Diagnosis not present

## 2017-10-14 DIAGNOSIS — D122 Benign neoplasm of ascending colon: Secondary | ICD-10-CM

## 2017-10-14 DIAGNOSIS — K640 First degree hemorrhoids: Secondary | ICD-10-CM | POA: Insufficient documentation

## 2017-10-14 DIAGNOSIS — K5730 Diverticulosis of large intestine without perforation or abscess without bleeding: Secondary | ICD-10-CM | POA: Diagnosis not present

## 2017-10-14 DIAGNOSIS — D126 Benign neoplasm of colon, unspecified: Secondary | ICD-10-CM | POA: Diagnosis not present

## 2017-10-14 DIAGNOSIS — Z87891 Personal history of nicotine dependence: Secondary | ICD-10-CM | POA: Diagnosis not present

## 2017-10-14 DIAGNOSIS — Z90710 Acquired absence of both cervix and uterus: Secondary | ICD-10-CM | POA: Insufficient documentation

## 2017-10-14 HISTORY — PX: POLYPECTOMY: SHX5525

## 2017-10-14 HISTORY — PX: COLONOSCOPY WITH PROPOFOL: SHX5780

## 2017-10-14 SURGERY — COLONOSCOPY WITH PROPOFOL
Anesthesia: General | Site: Rectum

## 2017-10-14 MED ORDER — SODIUM CHLORIDE 0.9 % IV SOLN: INTRAVENOUS | Status: DC

## 2017-10-14 MED ORDER — STERILE WATER FOR IRRIGATION IR SOLN
Status: DC | PRN
  Administered 2017-10-14: 10 mL

## 2017-10-14 MED ORDER — LIDOCAINE HCL (CARDIAC) PF 100 MG/5ML IV SOSY
PREFILLED_SYRINGE | INTRAVENOUS | Status: DC | PRN
  Administered 2017-10-14: 40 mg via INTRAVENOUS

## 2017-10-14 MED ORDER — PROPOFOL 10 MG/ML IV BOLUS
INTRAVENOUS | Status: DC | PRN
  Administered 2017-10-14 (×4): 30 mg via INTRAVENOUS
  Administered 2017-10-14: 50 mg via INTRAVENOUS
  Administered 2017-10-14: 30 mg via INTRAVENOUS
  Administered 2017-10-14: 40 mg via INTRAVENOUS
  Administered 2017-10-14: 30 mg via INTRAVENOUS
  Administered 2017-10-14: 50 mg via INTRAVENOUS
  Administered 2017-10-14: 40 mg via INTRAVENOUS
  Administered 2017-10-14: 80 mg via INTRAVENOUS
  Administered 2017-10-14: 40 mg via INTRAVENOUS
  Administered 2017-10-14: 50 mg via INTRAVENOUS
  Administered 2017-10-14 (×2): 20 mg via INTRAVENOUS
  Administered 2017-10-14: 30 mg via INTRAVENOUS
  Administered 2017-10-14 (×2): 50 mg via INTRAVENOUS

## 2017-10-14 MED ORDER — LACTATED RINGERS IV SOLN
INTRAVENOUS | Status: DC
  Administered 2017-10-14: 08:00:00 via INTRAVENOUS

## 2017-10-14 MED ORDER — OXYCODONE HCL 5 MG PO TABS: 5.0000 mg | ORAL_TABLET | Freq: Once | ORAL | Status: DC | PRN

## 2017-10-14 MED ORDER — OXYCODONE HCL 5 MG/5ML PO SOLN: 5.0000 mg | Freq: Once | ORAL | Status: DC | PRN

## 2017-10-14 SURGICAL SUPPLY — 7 items
CANISTER SUCT 1200ML W/VALVE (MISCELLANEOUS) ×2 IMPLANT
GOWN CVR UNV OPN BCK APRN NK (MISCELLANEOUS) ×2 IMPLANT
GOWN ISOL THUMB LOOP REG UNIV (MISCELLANEOUS) ×2
KIT ENDO PROCEDURE OLY (KITS) ×2 IMPLANT
SNARE SHORT THROW 13M SML OVAL (MISCELLANEOUS) ×2 IMPLANT
TRAP ETRAP POLY (MISCELLANEOUS) ×2 IMPLANT
WATER STERILE IRR 250ML POUR (IV SOLUTION) ×2 IMPLANT

## 2017-10-14 NOTE — Anesthesia Postprocedure Evaluation (Signed)
Anesthesia Post Note  Patient: Karen Odom  Procedure(s) Performed: COLONOSCOPY WITH PROPOFOL WITH BIOPSY (N/A Rectum) POLYPECTOMY (N/A Rectum)  Patient location during evaluation: PACU Anesthesia Type: General Level of consciousness: awake and alert Pain management: pain level controlled Vital Signs Assessment: post-procedure vital signs reviewed and stable Respiratory status: spontaneous breathing, nonlabored ventilation, respiratory function stable and patient connected to nasal cannula oxygen Cardiovascular status: blood pressure returned to baseline and stable Postop Assessment: no apparent nausea or vomiting Anesthetic complications: no    Zakirah Weingart

## 2017-10-14 NOTE — Transfer of Care (Signed)
Immediate Anesthesia Transfer of Care Note  Patient: Karen Odom  Procedure(s) Performed: COLONOSCOPY WITH PROPOFOL WITH BIOPSY (N/A Rectum) POLYPECTOMY (N/A Rectum)  Patient Location: PACU  Anesthesia Type: General  Level of Consciousness: awake, alert  and patient cooperative  Airway and Oxygen Therapy: Patient Spontanous Breathing and Patient connected to supplemental oxygen  Post-op Assessment: Post-op Vital signs reviewed, Patient's Cardiovascular Status Stable, Respiratory Function Stable, Patent Airway and No signs of Nausea or vomiting  Post-op Vital Signs: Reviewed and stable  Complications: No apparent anesthesia complications

## 2017-10-14 NOTE — Anesthesia Procedure Notes (Signed)
Performed by: Vila Dory, CRNA Pre-anesthesia Checklist: Patient identified, Emergency Drugs available, Suction available, Timeout performed and Patient being monitored Patient Re-evaluated:Patient Re-evaluated prior to induction Oxygen Delivery Method: Nasal cannula Placement Confirmation: positive ETCO2       

## 2017-10-14 NOTE — H&P (Signed)
Lucilla Lame, MD [facility] [address] [place], [place] [address] Phone: [phone] Fax : [phone]  Primary Care Physician:  Crecencio Mc, MD Primary Gastroenterologist:  Dr. Allen Norris  Pre-Procedure History & Physical: HPI:  Karen Odom is a 62 y.o. female is here for a screening colonoscopy.   Past Medical History:  Diagnosis Date  . Chicken pox   . HPV test positive 2004  . Hyperlipidemia   . Hypertension     Past Surgical History:  Procedure Laterality Date  . ABDOMINAL HYSTERECTOMY    . BREAST BIOPSY Bilateral    2 x right breast 1 X left breast negative [phone]    Prior to Admission medications   Medication Sig Start Date End Date Taking? Authorizing Provider  lisinopril (PRINIVIL,ZESTRIL) 10 MG tablet TAKE 1 TABLET BY MOUTH DAILY. 10/03/17  Yes Crecencio Mc, MD    Allergies as of 10/03/2017  . (No Known Allergies)    Family History  Problem Relation Age of Onset  . Alcohol abuse Father   . Heart disease Father   . Hyperlipidemia Father   . Hypertension Father   . Early death Father   . Diabetes Father   . Breast cancer Neg Hx     Social History   Socioeconomic History  . Marital status: Divorced    Spouse name: Not on file  . Number of children: Not on file  . Years of education: Not on file  . Highest education level: Not on file  Occupational History  . Not on file  Social Needs  . Financial resource strain: Not on file  . Food insecurity:    Worry: Not on file    Inability: Not on file  . Transportation needs:    Medical: Not on file    Non-medical: Not on file  Tobacco Use  . Smoking status: Former Smoker    Packs/day: 1.00    Types: Cigarettes    Last attempt to quit: 07/26/1991    Years since quitting: 26.2  . Smokeless tobacco: Never Used  Substance and Sexual Activity  . Alcohol use: Yes  . Drug use: No  . Sexual activity: Never  Lifestyle  . Physical activity:    Days per week: Not on file    Minutes  per session: Not on file  . Stress: Not on file  Relationships  . Social connections:    Talks on phone: Not on file    Gets together: Not on file    Attends religious service: Not on file    Active member of club or organization: Not on file    Attends meetings of clubs or organizations: Not on file    Relationship status: Not on file  . Intimate partner violence:    Fear of current or ex partner: Not on file    Emotionally abused: Not on file    Physically abused: Not on file    Forced sexual activity: Not on file  Other Topics Concern  . Not on file  Social History Narrative  . Not on file    Review of Systems: See HPI, otherwise negative ROS  Physical Exam: BP 134/89   Pulse 92   Temp 98.1 F (36.7 C) (Temporal)   Resp 16   Ht 5\' 6"  (1.676 m)   Wt 59.4 kg   SpO2 99%   BMI 21.14 kg/m  General:   Alert,  pleasant and cooperative in NAD Head:  Normocephalic and atraumatic. Neck:  Supple; no masses or thyromegaly. Lungs:  Clear throughout to auscultation.    Heart:  Regular rate and rhythm. Abdomen:  Soft, nontender and nondistended. Normal bowel sounds, without guarding, and without rebound.   Neurologic:  Alert and  oriented x4;  grossly normal neurologically.  Impression/Plan: Karen Odom is now here to undergo a screening colonoscopy.  Risks, benefits, and alternatives regarding colonoscopy have been reviewed with the patient.  Questions have been answered.  All parties agreeable.

## 2017-10-14 NOTE — Op Note (Signed)
[facility] Patient Name: Karen Odom Procedure Date: 10/14/2017 8:22 AM MRN: [id number] Account #: [id number] Date of Birth: 1955/08/11 Admit Type: Outpatient Age: 62 Room: [facility] OR ROOM 01 Gender: Female Note Status: Finalized Procedure:            Colonoscopy Indications:          Screening for colorectal malignant neoplasm Providers:            Lucilla Lame MD, MD Referring MD:         Deborra Medina, MD (Referring MD) Medicines:            Propofol per Anesthesia Complications:        No immediate complications. Procedure:            Pre-Anesthesia Assessment:                       - Prior to the procedure, a History and Physical was                        performed, and patient medications and allergies were                        reviewed. The patient's tolerance of previous                        anesthesia was also reviewed. The risks and benefits of                        the procedure and the sedation options and risks were                        discussed with the patient. All questions were                        answered, and informed consent was obtained. Prior                        Anticoagulants: The patient has taken no previous                        anticoagulant or antiplatelet agents. ASA Grade                        Assessment: II - A patient with mild systemic disease.                        After reviewing the risks and benefits, the patient was                        deemed in satisfactory condition to undergo the                        procedure.                       After obtaining informed consent, the colonoscope was                        passed under direct vision. Throughout the procedure,  the patient's blood pressure, pulse, and oxygen                        saturations were monitored continuously. The was                        introduced through the anus and advanced to the the              cecum, identified by appendiceal orifice and ileocecal                        valve. The colonoscopy was performed without                        difficulty. The patient tolerated the procedure well.                        The quality of the bowel preparation was good. Findings:      The perianal and digital rectal examinations were normal.      A 4 mm polyp was found in the ascending colon. The polyp was sessile.       The polyp was removed with a cold snare. Resection and retrieval were       complete.      Multiple small-mouthed diverticula were found in the sigmoid colon.      Non-bleeding internal hemorrhoids were found during retroflexion. The       hemorrhoids were Grade I (internal hemorrhoids that do not prolapse). Impression:           - One 4 mm polyp in the ascending colon, removed with a                        cold snare. Resected and retrieved.                       - Diverticulosis in the sigmoid colon.                       - Non-bleeding internal hemorrhoids. Recommendation:       - Discharge patient to home.                       - Resume previous diet.                       - Continue present medications.                       - Await pathology results.                       - Repeat colonoscopy in 5 years if polyp adenoma and 10                        years if hyperplastic Procedure Code(s):    --- Professional ---                       [phone], Colonoscopy, flexible; with removal of tumor(s),                        polyp(s), or other lesion(s) by snare technique  Diagnosis Code(s):    --- Professional ---                       Z12.11, Encounter for screening for malignant neoplasm                        of colon                       D12.2, Benign neoplasm of ascending colon CPT copyright 2017 American Medical Association. All rights reserved. The codes documented in this report are preliminary and upon coder review may  be revised to meet current  compliance requirements. Lucilla Lame MD, MD 10/14/2017 8:50:12 AM This report has been signed electronically. Number of Addenda: 0 Note Initiated On: 10/14/2017 8:22 AM Scope Withdrawal Time: 0 hours 11 minutes 2 seconds  Total Procedure Duration: 0 hours 16 minutes 22 seconds       [facility]

## 2017-10-14 NOTE — Anesthesia Preprocedure Evaluation (Signed)
Anesthesia Evaluation  Patient identified by MRN, date of birth, ID band  Reviewed: NPO status   History of Anesthesia Complications Negative for: history of anesthetic complications  Airway Mallampati: II  TM Distance: >3 FB Neck ROM: full    Dental no notable dental hx.    Pulmonary neg pulmonary ROS, former smoker,    Pulmonary exam normal        Cardiovascular Exercise Tolerance: Good hypertension, Normal cardiovascular exam     Neuro/Psych negative neurological ROS  negative psych ROS   GI/Hepatic negative GI ROS, Neg liver ROS,   Endo/Other  negative endocrine ROS  Renal/GU negative Renal ROS  negative genitourinary   Musculoskeletal   Abdominal   Peds  Hematology negative hematology ROS (+)   Anesthesia Other Findings   Reproductive/Obstetrics                             Anesthesia Physical Anesthesia Plan  ASA: II  Anesthesia Plan: General   Post-op Pain Management:    Induction:   PONV Risk Score and Plan:   Airway Management Planned: Natural Airway  Additional Equipment:   Intra-op Plan:   Post-operative Plan:   Informed Consent: I have reviewed the patients History and Physical, chart, labs and discussed the procedure including the risks, benefits and alternatives for the proposed anesthesia with the patient or authorized representative who has indicated his/her understanding and acceptance.     Plan Discussed with: CRNA  Anesthesia Plan Comments:         Anesthesia Quick Evaluation

## 2017-10-18 LAB — SURGICAL PATHOLOGY

## 2017-10-20 ENCOUNTER — Encounter: Payer: Self-pay | Admitting: Gastroenterology

## 2017-11-16 ENCOUNTER — Ambulatory Visit
Admission: RE | Admit: 2017-11-16 | Discharge: 2017-11-16 | Disposition: A | Discharge status: Home / Self Care | Payer: 59 | Source: Ambulatory Visit | Attending: Internal Medicine | Admitting: Internal Medicine

## 2017-11-16 DIAGNOSIS — Z1231 Encounter for screening mammogram for malignant neoplasm of breast: Secondary | ICD-10-CM | POA: Diagnosis not present

## 2018-01-19 DIAGNOSIS — D485 Neoplasm of uncertain behavior of skin: Secondary | ICD-10-CM | POA: Diagnosis not present

## 2018-01-19 DIAGNOSIS — L578 Other skin changes due to chronic exposure to nonionizing radiation: Secondary | ICD-10-CM | POA: Diagnosis not present

## 2018-02-24 DIAGNOSIS — D225 Melanocytic nevi of trunk: Secondary | ICD-10-CM | POA: Diagnosis not present

## 2018-02-24 DIAGNOSIS — D485 Neoplasm of uncertain behavior of skin: Secondary | ICD-10-CM | POA: Diagnosis not present

## 2018-04-10 ENCOUNTER — Other Ambulatory Visit: Payer: Self-pay | Admitting: Internal Medicine

## 2018-04-10 MED FILL — LISINOPRIL 10 MG TABLET: 10 | 90 days supply | Qty: 90 | Fill #0

## 2018-05-29 ENCOUNTER — Other Ambulatory Visit: Payer: Self-pay | Admitting: Internal Medicine

## 2018-05-29 DIAGNOSIS — Z1231 Encounter for screening mammogram for malignant neoplasm of breast: Secondary | ICD-10-CM

## 2018-07-26 DIAGNOSIS — Z808 Family history of malignant neoplasm of other organs or systems: Secondary | ICD-10-CM | POA: Diagnosis not present

## 2018-07-26 DIAGNOSIS — Z86018 Personal history of other benign neoplasm: Secondary | ICD-10-CM | POA: Diagnosis not present

## 2018-07-26 DIAGNOSIS — L578 Other skin changes due to chronic exposure to nonionizing radiation: Secondary | ICD-10-CM | POA: Diagnosis not present

## 2018-08-28 ENCOUNTER — Other Ambulatory Visit: Payer: Self-pay | Admitting: Family Medicine

## 2018-08-28 ENCOUNTER — Other Ambulatory Visit: Payer: Self-pay

## 2018-08-28 ENCOUNTER — Ambulatory Visit
Admission: RE | Admit: 2018-08-28 | Discharge: 2018-08-28 | Disposition: A | Discharge status: Home / Self Care | Payer: PRIVATE HEALTH INSURANCE | Source: Ambulatory Visit | Attending: Family Medicine | Admitting: Family Medicine

## 2018-08-28 ENCOUNTER — Ambulatory Visit
Admission: RE | Admit: 2018-08-28 | Discharge: 2018-08-28 | Disposition: A | Discharge status: Home / Self Care | Payer: PRIVATE HEALTH INSURANCE | Attending: Emergency Medicine | Admitting: Emergency Medicine

## 2018-08-28 DIAGNOSIS — T1490XA Injury, unspecified, initial encounter: Secondary | ICD-10-CM | POA: Insufficient documentation

## 2018-08-28 DIAGNOSIS — R52 Pain, unspecified: Secondary | ICD-10-CM | POA: Diagnosis not present

## 2018-09-06 ENCOUNTER — Ambulatory Visit
Admission: RE | Admit: 2018-09-06 | Discharge: 2018-09-06 | Disposition: A | Discharge status: Home / Self Care | Payer: 59 | Source: Ambulatory Visit | Attending: Internal Medicine | Admitting: Internal Medicine

## 2018-09-06 ENCOUNTER — Other Ambulatory Visit: Payer: Self-pay

## 2018-09-06 DIAGNOSIS — Z1231 Encounter for screening mammogram for malignant neoplasm of breast: Secondary | ICD-10-CM | POA: Insufficient documentation

## 2018-09-19 ENCOUNTER — Encounter: Payer: Self-pay | Admitting: Internal Medicine

## 2018-10-04 ENCOUNTER — Encounter: Payer: Self-pay | Admitting: Internal Medicine

## 2018-10-04 ENCOUNTER — Encounter: Payer: 59 | Admitting: Internal Medicine

## 2018-10-09 ENCOUNTER — Other Ambulatory Visit: Payer: Self-pay | Admitting: Internal Medicine

## 2018-10-10 ENCOUNTER — Other Ambulatory Visit: Payer: Self-pay | Admitting: Internal Medicine

## 2018-10-10 MED ORDER — TELMISARTAN 40 MG PO TABS: 40.0000 mg | ORAL_TABLET | Freq: Every day | ORAL | 1 refills | Status: DC

## 2018-10-10 NOTE — Telephone Encounter (Signed)
Refill request for lisinopril.

## 2018-10-12 ENCOUNTER — Ambulatory Visit (INDEPENDENT_AMBULATORY_CARE_PROVIDER_SITE_OTHER): Payer: 59 | Admitting: Internal Medicine

## 2018-10-12 ENCOUNTER — Other Ambulatory Visit: Payer: Self-pay

## 2018-10-12 ENCOUNTER — Encounter: Payer: Self-pay | Admitting: Internal Medicine

## 2018-10-12 VITALS — BP 138/96 | HR 66 | Temp 97.8°F | Resp 14 | Ht 66.0 in | Wt 133.8 lb

## 2018-10-12 DIAGNOSIS — I10 Essential (primary) hypertension: Secondary | ICD-10-CM | POA: Diagnosis not present

## 2018-10-12 DIAGNOSIS — E785 Hyperlipidemia, unspecified: Secondary | ICD-10-CM

## 2018-10-12 DIAGNOSIS — R5383 Other fatigue: Secondary | ICD-10-CM | POA: Diagnosis not present

## 2018-10-12 DIAGNOSIS — Z0000 Encounter for general adult medical examination without abnormal findings: Secondary | ICD-10-CM

## 2018-10-12 DIAGNOSIS — M8589 Other specified disorders of bone density and structure, multiple sites: Secondary | ICD-10-CM | POA: Diagnosis not present

## 2018-10-12 NOTE — Assessment & Plan Note (Signed)

## 2018-10-12 NOTE — Patient Instructions (Addendum)
I HAVE ORDERED YOUR DEXA   Return at your leisure for fasting labs   Check your BP a week after starting Micardis and let me know the result via mychart     Health Maintenance for Postmenopausal Women Menopause is a normal process in which your ability to get pregnant comes to an end. This process happens slowly over many months or years, usually between the ages of 67 and 71. Menopause is complete when you have missed your menstrual periods for 12 months. It is important to talk with your health care provider about some of the most common conditions that affect women after menopause (postmenopausal women). These include heart disease, cancer, and bone loss (osteoporosis). Adopting a healthy lifestyle and getting preventive care can help to promote your health and wellness. The actions you take can also lower your chances of developing some of these common conditions. What should I know about menopause? During menopause, you may get a number of symptoms, such as:  Hot flashes. These can be moderate or severe.  Night sweats.  Decrease in sex drive.  Mood swings.  Headaches.  Tiredness.  Irritability.  Memory problems.  Insomnia. Choosing to treat or not to treat these symptoms is a decision that you make with your health care provider. Do I need hormone replacement therapy?  Hormone replacement therapy is effective in treating symptoms that are caused by menopause, such as hot flashes and night sweats.  Hormone replacement carries certain risks, especially as you become older. If you are thinking about using estrogen or estrogen with progestin, discuss the benefits and risks with your health care provider. What is my risk for heart disease and stroke? The risk of heart disease, heart attack, and stroke increases as you age. One of the causes may be a change in the body's hormones during menopause. This can affect how your body uses dietary fats, triglycerides, and cholesterol.  Heart attack and stroke are medical emergencies. There are many things that you can do to help prevent heart disease and stroke. Watch your blood pressure  High blood pressure causes heart disease and increases the risk of stroke. This is more likely to develop in people who have high blood pressure readings, are of African descent, or are overweight.  Have your blood pressure checked: ? Every 3-5 years if you are 22-17 years of age. ? Every year if you are 52 years old or older. Eat a healthy diet   Eat a diet that includes plenty of vegetables, fruits, low-fat dairy products, and lean protein.  Do not eat a lot of foods that are high in solid fats, added sugars, or sodium. Get regular exercise Get regular exercise. This is one of the most important things you can do for your health. Most adults should:  Try to exercise for at least 150 minutes each week. The exercise should increase your heart rate and make you sweat (moderate-intensity exercise).  Try to do strengthening exercises at least twice each week. Do these in addition to the moderate-intensity exercise.  Spend less time sitting. Even light physical activity can be beneficial. Other tips  Work with your health care provider to achieve or maintain a healthy weight.  Do not use any products that contain nicotine or tobacco, such as cigarettes, e-cigarettes, and chewing tobacco. If you need help quitting, ask your health care provider.  Know your numbers. Ask your health care provider to check your cholesterol and your blood sugar (glucose). Continue to have your blood  tested as directed by your health care provider. Do I need screening for cancer? Depending on your health history and family history, you may need to have cancer screening at different stages of your life. This may include screening for:  Breast cancer.  Cervical cancer.  Lung cancer.  Colorectal cancer. What is my risk for osteoporosis? After menopause,  you may be at increased risk for osteoporosis. Osteoporosis is a condition in which bone destruction happens more quickly than new bone creation. To help prevent osteoporosis or the bone fractures that can happen because of osteoporosis, you may take the following actions:  If you are 59-42 years old, get at least 1,000 mg of calcium and at least 600 mg of vitamin D per day.  If you are older than age 35 but younger than age 15, get at least 1,200 mg of calcium and at least 600 mg of vitamin D per day.  If you are older than age 15, get at least 1,200 mg of calcium and at least 800 mg of vitamin D per day. Smoking and drinking excessive alcohol increase the risk of osteoporosis. Eat foods that are rich in calcium and vitamin D, and do weight-bearing exercises several times each week as directed by your health care provider. How does menopause affect my mental health? Depression may occur at any age, but it is more common as you become older. Common symptoms of depression include:  Low or sad mood.  Changes in sleep patterns.  Changes in appetite or eating patterns.  Feeling an overall lack of motivation or enjoyment of activities that you previously enjoyed.  Frequent crying spells. Talk with your health care provider if you think that you are experiencing depression. General instructions See your health care provider for regular wellness exams and vaccines. This may include:  Scheduling regular health, dental, and eye exams.  Getting and maintaining your vaccines. These include: ? Influenza vaccine. Get this vaccine each year before the flu season begins. ? Pneumonia vaccine. ? Shingles vaccine. ? Tetanus, diphtheria, and pertussis (Tdap) booster vaccine. Your health care provider may also recommend other immunizations. Tell your health care provider if you have ever been abused or do not feel safe at home. Summary  Menopause is a normal process in which your ability to get  pregnant comes to an end.  This condition causes hot flashes, night sweats, decreased interest in sex, mood swings, headaches, or lack of sleep.  Treatment for this condition may include hormone replacement therapy.  Take actions to keep yourself healthy, including exercising regularly, eating a healthy diet, watching your weight, and checking your blood pressure and blood sugar levels.  Get screened for cancer and depression. Make sure that you are up to date with all your vaccines. This information is not intended to replace advice given to you by your health care provider. Make sure you discuss any questions you have with your health care provider. Document Released: 02/26/2005 Document Revised: 12/28/2017 Document Reviewed: 12/28/2017 Elsevier Patient Education  2020 [employer].

## 2018-10-12 NOTE — Assessment & Plan Note (Signed)
I am making a decision to change patient's ACE Inhibitor to an ARB  based on increased reports of  angioedema.  I also advised patient to to take it at night instead of morning,  as recent studies have shown a reduction in incidence of heart attacks and strokes.  

## 2018-10-12 NOTE — Assessment & Plan Note (Signed)
Her DEXA needs repeating given her recent distal radius fracture.

## 2018-10-12 NOTE — Progress Notes (Signed)
Patient ID: Karen Odom, female    DOB: 02-12-1955  Age: 63 y.o. MRN: [id number]  The patient is here for annual preventive examination and management of other chronic and acute problems.  Mammogram was normal August 2020 colonoscopy sept 2019    The risk factors are reflected in the social history.  The roster of all physicians providing medical care to patient - is listed in the Snapshot section of the chart.  Activities of daily living:  The patient is 100% independent in all ADLs: dressing, toileting, feeding as well as independent mobility  Home safety : The patient has smoke detectors in the home. They wear seatbelts.  There are no firearms at home. There is no violence in the home.   There is no risks for hepatitis, STDs or HIV. There is no   history of blood transfusion. They have no travel history to infectious disease endemic areas of the world.  The patient has seen their dentist in the last six month. They have seen their eye doctor in the last year. They admit to slight hearing difficulty with regard to whispered voices and some television programs.  They have deferred audiologic testing in the last year.  They do not  have excessive sun exposure. Discussed the need for sun protection: hats, long sleeves and use of sunscreen if there is significant sun exposure.   Diet: the importance of a healthy diet is discussed. They do have a healthy diet.  The benefits of regular aerobic exercise were discussed. She walks 4 times per week ,  20 minutes.   Depression screen: there are no signs or vegative symptoms of depression- irritability, change in appetite, anhedonia, sadness/tearfullness.  Cognitive assessment: the patient manages all their financial and personal affairs and is actively engaged. They could relate day,date,year and events; recalled 2/3 objects at 3 minutes; performed clock-face test normally.  The following portions of the patient's history were reviewed and  updated as appropriate: allergies, current medications, past family history, past medical history,  past surgical history, past social history  and problem list.  Visual acuity was not assessed per patient preference since she has regular follow up with her ophthalmologist. Hearing and body mass index were assessed and reviewed.   During the course of the visit the patient was educated and counseled about appropriate screening and preventive services including : fall prevention , diabetes screening, nutrition counseling, colorectal cancer screening, and recommended immunizations.    CC: The primary encounter diagnosis was Essential hypertension, benign. Diagnoses of Osteopenia of multiple sites, Fatigue, unspecified type, Hyperlipidemia LDL goal <130, and Visit for preventive health examination were also pertinent to this visit.  She sustained a fracture of the distal radius minimally  DISPLACED. Referred to  [place]  [facility] . was not casted  Currently Week 6   Doing great .  Released .  ALSO DIAGNOSED WITH DUPYTREN'S CONTRACTURE  Last DEXA 2018    Osteopenia    . History Karen Odom has a past medical history of Chicken pox, HPV test positive (2004), Hyperlipidemia, and Hypertension.   She has a past surgical history that includes Abdominal hysterectomy; Colonoscopy with propofol (N/A, 10/14/2017); polypectomy (N/A, 10/14/2017); and Breast biopsy (Bilateral).   Her family history includes Alcohol abuse in her father; Diabetes in her father; Early death in her father; Heart disease in her father; Hyperlipidemia in her father; Hypertension in her father.She reports that she quit smoking about 27 years ago. Her smoking  use included cigarettes. She smoked 1.00 pack per day. She has never used smokeless tobacco. She reports current alcohol use. She reports that she does not use drugs.  Outpatient Medications Prior to Visit  Medication Sig Dispense Refill  .  lisinopril (ZESTRIL) 10 MG tablet TAKE 1 TABLET BY MOUTH DAILY. 90 tablet 0  . meloxicam (MOBIC) 15 MG tablet     . telmisartan (MICARDIS) 40 MG tablet Take 1 tablet (40 mg total) by mouth at bedtime. (Patient not taking: Reported on 10/12/2018) 90 tablet 1   No facility-administered medications prior to visit.     Review of Systems   Patient denies headache, fevers, malaise, unintentional weight loss, skin rash, eye pain, sinus congestion and sinus pain, sore throat, dysphagia,  hemoptysis , cough, dyspnea, wheezing, chest pain, palpitations, orthopnea, edema, abdominal pain, nausea, melena, diarrhea, constipation, flank pain, dysuria, hematuria, urinary  Frequency, nocturia, numbness, tingling, seizures,  Focal weakness, Loss of consciousness,  Tremor, insomnia, depression, anxiety, and suicidal ideation.      Objective:  BP (!) 138/96 (BP Location: Left Arm, Patient Position: Sitting, Cuff Size: Normal)   Pulse 66   Temp 97.8 F (36.6 C) (Temporal)   Resp 14   Ht 5\' 6"  (1.676 m)   Wt 133 lb 12.8 oz (60.7 kg)   SpO2 98%   BMI 21.60 kg/m   Physical Exam  General appearance: alert, cooperative and appears stated age Ears: normal TM's and external ear canals both ears Throat: lips, mucosa, and tongue normal; teeth and gums normal Neck: no adenopathy, no carotid bruit, supple, symmetrical, trachea midline and thyroid not enlarged, symmetric, no tenderness/mass/nodules Back: symmetric, no curvature. ROM normal. No CVA tenderness. Lungs: clear to auscultation bilaterally Heart: regular rate and rhythm, S1, S2 normal, no murmur, click, rub or gallop Abdomen: soft, non-tender; bowel sounds normal; no masses,  no organomegaly Pulses: 2+ and symmetric Skin: Skin color, texture, turgor normal. No rashes or lesions Lymph nodes: Cervical, supraclavicular, and axillary nodes normal.  Assessment & Plan:   Problem List Items Addressed This Visit      Unprioritized   Essential  hypertension, benign - Primary    I am making a decision to change patient's ACE Inhibitor to an ARB  based on increased reports of  angioedema.  I also advised patient to to take it at night instead of morning,  as recent studies have shown a reduction in incidence of heart attacks and strokes.       Relevant Orders   Comprehensive metabolic panel   Osteopenia    Her DEXA needs repeating given her recent distal radius fracture.       Visit for preventive health examination    age appropriate education and counseling updated, referrals for preventative services and immunizations addressed, dietary and smoking counseling addressed, most recent labs reviewed.  I have personally reviewed and have noted:  1) the patient's medical and social history 2) The pt's use of alcohol, tobacco, and illicit drugs 3) The patient's current medications and supplements 4) Functional ability including ADL's, fall risk, home safety risk, hearing and visual impairment 5) Diet and physical activities 6) Evidence for depression or mood disorder 7) The patient's height, weight, and BMI have been recorded in the chart  I have made referrals, and provided counseling and education based on review of the above       Other Visit Diagnoses    Fatigue, unspecified type       Relevant Orders   TSH  CBC with Differential/Platelet   Hyperlipidemia LDL goal <130       Relevant Orders   Lipid panel      I have discontinued Sumar T. Viloria's lisinopril. I am also having her maintain her telmisartan and meloxicam.  No orders of the defined types were placed in this encounter.   Medications Discontinued During This Encounter  Medication Reason  . lisinopril (ZESTRIL) 10 MG tablet     Follow-up: No follow-ups on file.   Crecencio Mc, MD

## 2018-10-18 ENCOUNTER — Encounter: Payer: 59 | Admitting: Internal Medicine

## 2018-10-25 ENCOUNTER — Encounter: Payer: 59 | Admitting: Internal Medicine

## 2018-11-01 ENCOUNTER — Other Ambulatory Visit (INDEPENDENT_AMBULATORY_CARE_PROVIDER_SITE_OTHER): Payer: 59

## 2018-11-01 ENCOUNTER — Other Ambulatory Visit: Payer: Self-pay

## 2018-11-01 DIAGNOSIS — E785 Hyperlipidemia, unspecified: Secondary | ICD-10-CM

## 2018-11-01 DIAGNOSIS — I10 Essential (primary) hypertension: Secondary | ICD-10-CM

## 2018-11-01 DIAGNOSIS — R5383 Other fatigue: Secondary | ICD-10-CM | POA: Diagnosis not present

## 2018-11-01 LAB — CBC WITH DIFFERENTIAL/PLATELET
Basophils Absolute: 0 10*3/uL (ref 0.0–0.1)
Basophils Relative: 1.2 % (ref 0.0–3.0)
Eosinophils Absolute: 0.2 10*3/uL (ref 0.0–0.7)
Eosinophils Relative: 4.4 % (ref 0.0–5.0)
HCT: 42.6 % (ref 36.0–46.0)
Hemoglobin: 14.2 g/dL (ref 12.0–15.0)
Lymphocytes Relative: 44.8 % (ref 12.0–46.0)
Lymphs Abs: 1.9 10*3/uL (ref 0.7–4.0)
MCHC: 33.3 g/dL (ref 30.0–36.0)
MCV: 100.4 fl — ABNORMAL HIGH (ref 78.0–100.0)
Monocytes Absolute: 0.5 10*3/uL (ref 0.1–1.0)
Monocytes Relative: 11.5 % (ref 3.0–12.0)
Neutro Abs: 1.6 10*3/uL (ref 1.4–7.7)
Neutrophils Relative %: 38.1 % — ABNORMAL LOW (ref 43.0–77.0)
Platelets: 378 10*3/uL (ref 150.0–400.0)
RBC: 4.24 Mil/uL (ref 3.87–5.11)
RDW: 13.9 % (ref 11.5–15.5)
WBC: 4.2 10*3/uL (ref 4.0–10.5)

## 2018-11-01 LAB — LIPID PANEL
Cholesterol: 274 mg/dL — ABNORMAL HIGH (ref 0–200)
HDL: 101.1 mg/dL (ref >39.00)
LDL Cholesterol: 154 mg/dL — ABNORMAL HIGH (ref 0–99)
NonHDL: 172.7
Total CHOL/HDL Ratio: 3
Triglycerides: 93 mg/dL (ref 0.0–149.0)
VLDL: 18.6 mg/dL (ref 0.0–40.0)

## 2018-11-01 LAB — COMPREHENSIVE METABOLIC PANEL
ALT: 19 U/L (ref 0–35)
AST: 23 U/L (ref 0–37)
Albumin: 4.3 g/dL (ref 3.5–5.2)
Alkaline Phosphatase: 38 U/L — ABNORMAL LOW (ref 39–117)
BUN: 13 mg/dL (ref 6–23)
CO2: 28 mEq/L (ref 19–32)
Calcium: 9.6 mg/dL (ref 8.4–10.5)
Chloride: 103 mEq/L (ref 96–112)
Creatinine, Ser: 0.73 mg/dL (ref 0.40–1.20)
GFR: 80.65 mL/min (ref >60.00)
Glucose, Bld: 108 mg/dL — ABNORMAL HIGH (ref 70–99)
Potassium: 4.5 mEq/L (ref 3.5–5.1)
Sodium: 138 mEq/L (ref 135–145)
Total Bilirubin: 0.4 mg/dL (ref 0.2–1.2)
Total Protein: 6.9 g/dL (ref 6.0–8.3)

## 2018-11-01 LAB — TSH: TSH: 1.9 u[IU]/mL (ref 0.35–4.50)

## 2018-11-08 DIAGNOSIS — Z780 Asymptomatic menopausal state: Secondary | ICD-10-CM

## 2018-11-16 ENCOUNTER — Encounter: Payer: Self-pay | Admitting: Internal Medicine

## 2018-12-26 ENCOUNTER — Telehealth: Payer: Self-pay

## 2018-12-26 NOTE — Telephone Encounter (Signed)
Pt dropped off a letter and some bp readings(placed in yellow folder). The letter stated that she was switched from Lisinopril to Telmisartan in September and she has noticed her bp readings have been increasing. Pt is wanting to know if she needs to increase her temlisartan dose or switch back to lisinopril.   BP readings have been placed in yellow results folder.

## 2019-01-09 ENCOUNTER — Other Ambulatory Visit: Payer: Self-pay | Admitting: Internal Medicine

## 2019-01-09 ENCOUNTER — Telehealth: Payer: Self-pay | Admitting: Internal Medicine

## 2019-01-09 MED ORDER — HYDROCHLOROTHIAZIDE 25 MG PO TABS: 25.0000 mg | ORAL_TABLET | Freq: Every day | ORAL | 3 refills | Status: DC

## 2019-01-09 MED ORDER — TELMISARTAN 80 MG PO TABS: 80.0000 mg | ORAL_TABLET | Freq: Every day | ORAL | 1 refills | Status: DC

## 2019-01-09 NOTE — Telephone Encounter (Signed)
Spoke with pt to let her know that Dr. Derrel Nip did not send in the Lisinopril that it must have been an automatic refill and that she should not pick up the Lisinopril. I explained to pt that she should be taking the hydrochlorothiazide 25 mg daily in the mornings and the 80 mg telmisartan in the evenings. Pt gave a verbal understanding.

## 2019-01-09 NOTE — Telephone Encounter (Signed)
DO NOT REFILL LISINOPRIL. IT WAS DISCONTINUED A WHILE AGO  [facility] TELLING PATIENT WE REFILLED IT!!

## 2019-01-22 ENCOUNTER — Ambulatory Visit
Admission: RE | Admit: 2019-01-22 | Discharge: 2019-01-22 | Disposition: A | Discharge status: Home / Self Care | Payer: 59 | Source: Ambulatory Visit | Attending: Internal Medicine | Admitting: Internal Medicine

## 2019-01-22 DIAGNOSIS — M810 Age-related osteoporosis without current pathological fracture: Secondary | ICD-10-CM | POA: Diagnosis not present

## 2019-01-22 DIAGNOSIS — M8589 Other specified disorders of bone density and structure, multiple sites: Secondary | ICD-10-CM | POA: Diagnosis not present

## 2019-01-22 DIAGNOSIS — Z13820 Encounter for screening for osteoporosis: Secondary | ICD-10-CM | POA: Insufficient documentation

## 2019-01-22 DIAGNOSIS — Z780 Asymptomatic menopausal state: Secondary | ICD-10-CM | POA: Diagnosis not present

## 2019-05-29 DIAGNOSIS — M720 Palmar fascial fibromatosis [Dupuytren]: Secondary | ICD-10-CM | POA: Diagnosis not present

## 2019-07-04 ENCOUNTER — Other Ambulatory Visit: Payer: Self-pay

## 2019-07-04 MED ORDER — TELMISARTAN 80 MG PO TABS: 80.0000 mg | ORAL_TABLET | Freq: Every day | ORAL | 0 refills | Status: DC

## 2019-07-24 ENCOUNTER — Other Ambulatory Visit: Payer: Self-pay | Admitting: Internal Medicine

## 2019-07-24 DIAGNOSIS — Z1231 Encounter for screening mammogram for malignant neoplasm of breast: Secondary | ICD-10-CM

## 2019-08-07 DIAGNOSIS — M720 Palmar fascial fibromatosis [Dupuytren]: Secondary | ICD-10-CM | POA: Diagnosis not present

## 2019-08-09 DIAGNOSIS — M720 Palmar fascial fibromatosis [Dupuytren]: Secondary | ICD-10-CM | POA: Diagnosis not present

## 2019-08-10 DIAGNOSIS — M720 Palmar fascial fibromatosis [Dupuytren]: Secondary | ICD-10-CM | POA: Diagnosis not present

## 2019-08-23 DIAGNOSIS — M20021 Boutonniere deformity of right finger(s): Secondary | ICD-10-CM | POA: Diagnosis not present

## 2019-08-23 DIAGNOSIS — M720 Palmar fascial fibromatosis [Dupuytren]: Secondary | ICD-10-CM | POA: Diagnosis not present

## 2019-09-13 DIAGNOSIS — M720 Palmar fascial fibromatosis [Dupuytren]: Secondary | ICD-10-CM | POA: Diagnosis not present

## 2019-09-18 ENCOUNTER — Ambulatory Visit
Admission: RE | Admit: 2019-09-18 | Discharge: 2019-09-18 | Disposition: A | Discharge status: Home / Self Care | Payer: 59 | Source: Ambulatory Visit | Attending: Internal Medicine | Admitting: Internal Medicine

## 2019-09-18 ENCOUNTER — Other Ambulatory Visit: Payer: Self-pay

## 2019-09-18 DIAGNOSIS — Z1231 Encounter for screening mammogram for malignant neoplasm of breast: Secondary | ICD-10-CM | POA: Insufficient documentation

## 2019-09-20 ENCOUNTER — Other Ambulatory Visit: Payer: Self-pay

## 2019-10-04 ENCOUNTER — Other Ambulatory Visit: Payer: Self-pay | Admitting: Internal Medicine

## 2019-10-05 ENCOUNTER — Other Ambulatory Visit: Payer: Self-pay | Admitting: Internal Medicine

## 2019-10-05 NOTE — Telephone Encounter (Signed)
Pt needs both of her medications sent to [facility] today before they close.

## 2019-10-08 ENCOUNTER — Other Ambulatory Visit: Payer: Self-pay | Admitting: Internal Medicine

## 2019-10-08 MED ORDER — HYDROCHLOROTHIAZIDE 25 MG PO TABS: 25.0000 mg | ORAL_TABLET | Freq: Every day | ORAL | 3 refills | Status: DC

## 2019-10-10 ENCOUNTER — Other Ambulatory Visit: Payer: Self-pay | Admitting: Internal Medicine

## 2019-10-10 MED ORDER — TELMISARTAN 80 MG PO TABS: 80.0000 mg | ORAL_TABLET | Freq: Every day | ORAL | 0 refills | Status: DC

## 2019-10-18 ENCOUNTER — Encounter: Payer: Self-pay | Admitting: Internal Medicine

## 2019-10-18 ENCOUNTER — Other Ambulatory Visit: Payer: Self-pay

## 2019-10-18 ENCOUNTER — Ambulatory Visit (INDEPENDENT_AMBULATORY_CARE_PROVIDER_SITE_OTHER): Payer: 59 | Admitting: Internal Medicine

## 2019-10-18 ENCOUNTER — Other Ambulatory Visit (HOSPITAL_COMMUNITY)
Admission: RE | Admit: 2019-10-18 | Discharge: 2019-10-18 | Disposition: A | Discharge status: Home / Self Care | Payer: 59 | Source: Ambulatory Visit | Attending: Internal Medicine | Admitting: Internal Medicine

## 2019-10-18 VITALS — BP 110/78 | HR 95 | Temp 98.7°F | Resp 13 | Ht 66.0 in | Wt 142.6 lb

## 2019-10-18 DIAGNOSIS — Z124 Encounter for screening for malignant neoplasm of cervix: Secondary | ICD-10-CM

## 2019-10-18 DIAGNOSIS — Z0000 Encounter for general adult medical examination without abnormal findings: Secondary | ICD-10-CM | POA: Diagnosis not present

## 2019-10-18 DIAGNOSIS — E559 Vitamin D deficiency, unspecified: Secondary | ICD-10-CM | POA: Diagnosis not present

## 2019-10-18 DIAGNOSIS — M810 Age-related osteoporosis without current pathological fracture: Secondary | ICD-10-CM

## 2019-10-18 DIAGNOSIS — M720 Palmar fascial fibromatosis [Dupuytren]: Secondary | ICD-10-CM | POA: Insufficient documentation

## 2019-10-18 DIAGNOSIS — E785 Hyperlipidemia, unspecified: Secondary | ICD-10-CM | POA: Diagnosis not present

## 2019-10-18 DIAGNOSIS — I10 Essential (primary) hypertension: Secondary | ICD-10-CM

## 2019-10-18 LAB — COMPREHENSIVE METABOLIC PANEL
ALT: 19 U/L (ref 0–35)
AST: 19 U/L (ref 0–37)
Albumin: 4.6 g/dL (ref 3.5–5.2)
Alkaline Phosphatase: 42 U/L (ref 39–117)
BUN: 20 mg/dL (ref 6–23)
CO2: 29 mEq/L (ref 19–32)
Calcium: 10.1 mg/dL (ref 8.4–10.5)
Chloride: 99 mEq/L (ref 96–112)
Creatinine, Ser: 0.72 mg/dL (ref 0.40–1.20)
GFR: 81.69 mL/min (ref >60.00)
Glucose, Bld: 95 mg/dL (ref 70–99)
Potassium: 4 mEq/L (ref 3.5–5.1)
Sodium: 137 mEq/L (ref 135–145)
Total Bilirubin: 0.9 mg/dL (ref 0.2–1.2)
Total Protein: 7 g/dL (ref 6.0–8.3)

## 2019-10-18 LAB — LIPID PANEL
Cholesterol: 295 mg/dL — ABNORMAL HIGH (ref 0–200)
HDL: 115.6 mg/dL (ref >39.00)
LDL Cholesterol: 162 mg/dL — ABNORMAL HIGH (ref 0–99)
NonHDL: 179.62
Total CHOL/HDL Ratio: 3
Triglycerides: 88 mg/dL (ref 0.0–149.0)
VLDL: 17.6 mg/dL (ref 0.0–40.0)

## 2019-10-18 LAB — VITAMIN D 25 HYDROXY (VIT D DEFICIENCY, FRACTURES): VITD: 23.17 ng/mL — ABNORMAL LOW (ref 30.00–100.00)

## 2019-10-18 NOTE — Patient Instructions (Signed)

## 2019-10-18 NOTE — Assessment & Plan Note (Addendum)
S/p Ziaflex procedure in July by [place].  The condition is hereditary:  She has found several 2nd degree relatives with the condition

## 2019-10-18 NOTE — Progress Notes (Signed)
Patient ID: Karen Odom, female    DOB: Jan 01, 1956  Age: 64 y.o. MRN: [id number]  The patient is here for annual preventive examination and management of other chronic and acute problems.   The risk factors are reflected in the social history.  The roster of all physicians providing medical care to patient - is listed in the Snapshot section of the chart.  Activities of daily living:  The patient is 100% independent in all ADLs: dressing, toileting, feeding as well as independent mobility  Home safety : The patient has smoke detectors in the home. They wear seatbelts.  There are no firearms at home. There is no violence in the home.   There is no risks for hepatitis, STDs or HIV. There is no   history of blood transfusion. They have no travel history to infectious disease endemic areas of the world.  The patient has seen their dentist in the last six month. They have seen their eye doctor in the last year. They admit to slight hearing difficulty with regard to whispered voices and some television programs.  They have deferred audiologic testing in the last year.  They do not  have excessive sun exposure. Discussed the need for sun protection: hats, long sleeves and use of sunscreen if there is significant sun exposure.   Diet: the importance of a healthy diet is discussed. They do have a healthy diet.  The benefits of regular aerobic exercise were discussed. She walks 5 times per week ,  30 minutes.   Depression screen: there are no signs or vegative symptoms of depression- irritability, change in appetite, anhedonia, sadness/tearfullness.  Cognitive assessment: the patient manages all their financial and personal affairs and is actively engaged. They could relate day,date,year and events; recalled 2/3 objects at 3 minutes; performed clock-face test normally.  The following portions of the patient's history were reviewed and updated as appropriate: allergies, current medications, past family  history, past medical history,  past surgical history, past social history  and problem list.  Visual acuity was not assessed per patient preference since she has regular follow up with her ophthalmologist. Hearing and body mass index were assessed and reviewed.   During the course of the visit the patient was educated and counseled about appropriate screening and preventive services including : fall prevention , diabetes screening, nutrition counseling, colorectal cancer screening, and recommended immunizations.    CC: The primary encounter diagnosis was Visit for preventive health examination. Diagnoses of Dupuytren's contracture of right hand, Screening for cervical cancer, Dyslipidemia, Vitamin D deficiency, Essential hypertension, benign, and Osteoporosis of forearm were also pertinent to this visit.  History Lyzette has a past medical history of Chicken pox, Dental fistula (10/01/2016), HPV test positive (2004), Hyperlipidemia, and Hypertension.   She has a past surgical history that includes Abdominal hysterectomy; Colonoscopy with propofol (N/A, 10/14/2017); polypectomy (N/A, 10/14/2017); and Breast excisional biopsy (Bilateral).   Her family history includes Alcohol abuse in her father; Diabetes in her father; Early death in her father; Heart disease in her father; Hyperlipidemia in her father; Hypertension in her father.She reports that she quit smoking about 28 years ago. Her smoking use included cigarettes. She smoked 1.00 pack per day. She has never used smokeless tobacco. She reports current alcohol use. She reports that she does not use drugs.  Outpatient Medications Prior to Visit  Medication Sig Dispense Refill  . hydrochlorothiazide (HYDRODIURIL) 25 MG tablet Take 1 tablet (25 mg total) by mouth daily. 90 tablet 3  .  telmisartan (MICARDIS) 80 MG tablet Take 1 tablet (80 mg total) by mouth at bedtime. 30 tablet 0   No facility-administered medications prior to visit.    Review of  Systems   Patient denies headache, fevers, malaise, unintentional weight loss, skin rash, eye pain, sinus congestion and sinus pain, sore throat, dysphagia,  hemoptysis , cough, dyspnea, wheezing, chest pain, palpitations, orthopnea, edema, abdominal pain, nausea, melena, diarrhea, constipation, flank pain, dysuria, hematuria, urinary  Frequency, nocturia, numbness, tingling, seizures,  Focal weakness, Loss of consciousness,  Tremor, insomnia, depression, anxiety, and suicidal ideation.      Objective:  BP 110/78 (BP Location: Left Arm, Patient Position: Sitting, Cuff Size: Normal)   Pulse 95   Temp 98.7 F (37.1 C) (Oral)   Resp 13   Ht 5\' 6"  (1.676 m)   Wt 142 lb 9.6 oz (64.7 kg)   SpO2 99%   BMI 23.02 kg/m   Physical Exam  General Appearance:    Alert, cooperative, no distress, appears stated age  Head:    Normocephalic, without obvious abnormality, atraumatic  Eyes:    PERRL, conjunctiva/corneas clear, EOM's intact, fundi    benign, both eyes  Ears:    Normal TM's and external ear canals, both ears  Nose:   Nares normal, septum midline, mucosa normal, no drainage    or sinus tenderness  Throat:   Lips, mucosa, and tongue normal; teeth and gums normal  Neck:   Supple, symmetrical, trachea midline, no adenopathy;    thyroid:  no enlargement/tenderness/nodules; no carotid   bruit or JVD  Back:     Symmetric, no curvature, ROM normal, no CVA tenderness  Lungs:     Clear to auscultation bilaterally, respirations unlabored  Chest Wall:    No tenderness or deformity   Heart:    Regular rate and rhythm, S1 and S2 normal, no murmur, rub   or gallop  Breast Exam:    No tenderness, masses, or nipple abnormality  Abdomen:     Soft, non-tender, bowel sounds active all four quadrants,    no masses, no organomegaly  Genitalia:    Pelvic: cervix normal in appearance, external genitalia normal, no adnexal masses or tenderness, no cervical motion tenderness, rectovaginal septum normal,  uterus normal size, shape, and consistency and vagina normal without discharge  Extremities:   Extremities normal, atraumatic, no cyanosis or edema  Pulses:   2+ and symmetric all extremities  Skin:   Skin color, texture, turgor normal, no rashes or lesions  Lymph nodes:   Cervical, supraclavicular, and axillary nodes normal  Neurologic:   CNII-XII intact, normal strength, sensation and reflexes    throughout     Assessment & Plan:   Problem List Items Addressed This Visit      Unprioritized   Dupuytren's contracture of right hand    S/p Ziaflex procedure in July by [place].  The condition is hereditary:  She has found several 2nd degree relatives with the condition      Dyslipidemia    Using the Framingham risk calculator,  her 10 year risk of coronary artery disease is 7%.  No treatment recommended.    Lab Results  Component Value Date   CHOL 295 (H) 10/18/2019   HDL 115.60 10/18/2019   LDLCALC 162 (H) 10/18/2019   LDLDIRECT 120.0 08/23/2012   TRIG 88.0 10/18/2019   CHOLHDL 3 10/18/2019        Relevant Orders   Lipid panel (Completed)   Comprehensive  metabolic panel (Completed)   Essential hypertension, benign    Well controlled on current regimen of hctz  25 mg and telmisartan 80 mg daily.  Lisinopril was proactively discontinued at my advise due to risk of angioedema.  Renal function stable, no changes today.  Lab Results  Component Value Date   CREATININE 0.72 10/18/2019   Lab Results  Component Value Date   NA 137 10/18/2019   K 4.0 10/18/2019   CL 99 10/18/2019   CO2 29 10/18/2019         Osteoporosis of forearm    With history of distal radius fracture in 2020.  DEXA scan was repeated in Jan 2021 and diagnostic of osteoporosis in the forearm only.  Treatment Options reviewed       Screening for cervical cancer    PAP smear was done today       Relevant Orders   Cytology - PAP( [place]) (Completed)   Visit for  preventive health examination - Primary    age appropriate education and counseling updated, referrals for preventative services and immunizations addressed, dietary and smoking counseling addressed, most recent labs reviewed.  I have personally reviewed and have noted:  1) the patient's medical and social history 2) The pt's use of alcohol, tobacco, and illicit drugs 3) The patient's current medications and supplements 4) Functional ability including ADL's, fall risk, home safety risk, hearing and visual impairment 5) Diet and physical activities 6) Evidence for depression or mood disorder 7) The patient's height, weight, and BMI have been recorded in the chart  I have made referrals, and provided counseling and education based on review of the above      Vitamin D deficiency    Recurrent ,  With osteoporosis by 2021 DEXA.  Mega dose for 1 month recommended , followed by 2000 ius daily       Relevant Orders   VITAMIN D 25 Hydroxy (Vit-D Deficiency, Fractures) (Completed)      I am having Almyra Free T. Rottman maintain her hydrochlorothiazide and telmisartan.  No orders of the defined types were placed in this encounter.   There are no discontinued medications.  Follow-up: No follow-ups on file.   Crecencio Mc, MD

## 2019-10-19 LAB — CYTOLOGY - PAP
Comment: NEGATIVE
Diagnosis: NEGATIVE
High risk HPV: NEGATIVE

## 2019-10-20 ENCOUNTER — Encounter: Payer: Self-pay | Admitting: Internal Medicine

## 2019-10-20 DIAGNOSIS — E559 Vitamin D deficiency, unspecified: Secondary | ICD-10-CM | POA: Insufficient documentation

## 2019-10-20 DIAGNOSIS — E785 Hyperlipidemia, unspecified: Secondary | ICD-10-CM | POA: Insufficient documentation

## 2019-10-20 MED ORDER — ERGOCALCIFEROL 1.25 MG (50000 UT) PO CAPS: 50000.0000 [IU] | ORAL_CAPSULE | ORAL | 0 refills | Status: DC

## 2019-10-20 NOTE — Progress Notes (Signed)
Your  PAP smear was HPV negative but the endocervical zone was not able to be confirmed  due to "atrophy" (which commonly occurs after menopause). Current guidelines recommend repeating the PAP in one year.  Regards,   Deborra Medina, MD

## 2019-10-20 NOTE — Assessment & Plan Note (Addendum)
Well controlled on current regimen of hctz  25 mg and telmisartan 80 mg daily.  Lisinopril was proactively discontinued at my advise due to risk of angioedema.  Renal function stable, no changes today.  Lab Results  Component Value Date   CREATININE 0.72 10/18/2019   Lab Results  Component Value Date   NA 137 10/18/2019   K 4.0 10/18/2019   CL 99 10/18/2019   CO2 29 10/18/2019

## 2019-10-20 NOTE — Assessment & Plan Note (Addendum)
Using the Framingham risk calculator,  her 10 year risk of coronary artery disease is 7%.  No treatment recommended.    Lab Results  Component Value Date   CHOL 295 (H) 10/18/2019   HDL 115.60 10/18/2019   LDLCALC 162 (H) 10/18/2019   LDLDIRECT 120.0 08/23/2012   TRIG 88.0 10/18/2019   CHOLHDL 3 10/18/2019

## 2019-10-20 NOTE — Progress Notes (Signed)
Labs reviewed.  Your  ten year risk for cardiac events is still low at 7% , so no statin therapy is recommended   Your vitamin D is low again.   I am calling in a megadose of Vit D to take once weekly for a total of 4 weeks . after you finish the weekly Vitamin D supplement, you should start taking an OTC  Vit D3 supplement 2,000 units daily, November through April.    Regards,      Deborra Medina, MD

## 2019-10-20 NOTE — Assessment & Plan Note (Signed)
PAP smear was done today 

## 2019-10-20 NOTE — Assessment & Plan Note (Signed)

## 2019-10-20 NOTE — Assessment & Plan Note (Addendum)
With history of distal radius fracture in 2020.  DEXA scan was repeated in Jan 2021 and diagnostic of osteoporosis in the forearm only.  Treatment Options reviewed

## 2019-10-20 NOTE — Assessment & Plan Note (Signed)
Recurrent ,  With osteoporosis by 2021 DEXA.  Mega dose for 1 month recommended , followed by 2000 ius daily

## 2019-10-23 ENCOUNTER — Other Ambulatory Visit: Payer: Self-pay | Admitting: Internal Medicine

## 2019-10-23 MED ORDER — ERGOCALCIFEROL 1.25 MG (50000 UT) PO CAPS: 50000.0000 [IU] | ORAL_CAPSULE | ORAL | 0 refills | Status: DC

## 2019-12-04 DIAGNOSIS — H5203 Hypermetropia, bilateral: Secondary | ICD-10-CM | POA: Diagnosis not present

## 2020-02-08 ENCOUNTER — Ambulatory Visit: Payer: 59

## 2020-04-07 ENCOUNTER — Other Ambulatory Visit: Payer: Self-pay | Admitting: Internal Medicine

## 2020-04-22 ENCOUNTER — Other Ambulatory Visit: Payer: Self-pay

## 2020-06-26 DIAGNOSIS — Z808 Family history of malignant neoplasm of other organs or systems: Secondary | ICD-10-CM | POA: Diagnosis not present

## 2020-06-26 DIAGNOSIS — L578 Other skin changes due to chronic exposure to nonionizing radiation: Secondary | ICD-10-CM | POA: Diagnosis not present

## 2020-06-26 DIAGNOSIS — Z86018 Personal history of other benign neoplasm: Secondary | ICD-10-CM | POA: Diagnosis not present

## 2020-06-30 ENCOUNTER — Other Ambulatory Visit: Payer: Self-pay

## 2020-06-30 MED FILL — Telmisartan Tab 80 MG: ORAL | 90 days supply | Qty: 90 | Fill #0 | Status: AC

## 2020-06-30 MED FILL — Hydrochlorothiazide Tab 25 MG: ORAL | 90 days supply | Qty: 90 | Fill #0 | Status: AC

## 2020-07-24 ENCOUNTER — Other Ambulatory Visit: Payer: Self-pay

## 2020-07-24 ENCOUNTER — Telehealth: Payer: Self-pay | Admitting: Internal Medicine

## 2020-07-24 DIAGNOSIS — Z1231 Encounter for screening mammogram for malignant neoplasm of breast: Secondary | ICD-10-CM

## 2020-07-24 NOTE — Telephone Encounter (Signed)
Pt called and notified that she could call [facility] to scheduled since order has been placed. I let her know last mammogram was 09/18/19.

## 2020-07-24 NOTE — Telephone Encounter (Signed)
PT called to request for a order for her Annual 3D Mammogram to be put in for her.

## 2020-09-18 ENCOUNTER — Other Ambulatory Visit: Payer: Self-pay

## 2020-09-18 ENCOUNTER — Ambulatory Visit
Admission: RE | Admit: 2020-09-18 | Discharge: 2020-09-18 | Disposition: A | Discharge status: Home / Self Care | Payer: 59 | Source: Ambulatory Visit | Attending: Internal Medicine | Admitting: Internal Medicine

## 2020-09-18 DIAGNOSIS — Z1231 Encounter for screening mammogram for malignant neoplasm of breast: Secondary | ICD-10-CM | POA: Diagnosis not present

## 2020-10-01 ENCOUNTER — Other Ambulatory Visit: Payer: Self-pay

## 2020-10-01 ENCOUNTER — Other Ambulatory Visit: Payer: Self-pay | Admitting: Internal Medicine

## 2020-10-01 MED FILL — Telmisartan Tab 80 MG: ORAL | 90 days supply | Qty: 90 | Fill #0 | Status: AC

## 2020-10-01 MED FILL — Hydrochlorothiazide Tab 25 MG: ORAL | 90 days supply | Qty: 90 | Fill #1 | Status: AC

## 2020-10-03 ENCOUNTER — Other Ambulatory Visit: Payer: Self-pay

## 2020-10-17 ENCOUNTER — Encounter: Payer: Self-pay | Admitting: Internal Medicine

## 2020-10-17 ENCOUNTER — Other Ambulatory Visit: Payer: Self-pay

## 2020-10-17 ENCOUNTER — Other Ambulatory Visit (HOSPITAL_COMMUNITY)
Admission: RE | Admit: 2020-10-17 | Discharge: 2020-10-17 | Disposition: A | Discharge status: Home / Self Care | Payer: 59 | Source: Ambulatory Visit | Attending: Internal Medicine | Admitting: Internal Medicine

## 2020-10-17 ENCOUNTER — Ambulatory Visit (INDEPENDENT_AMBULATORY_CARE_PROVIDER_SITE_OTHER): Payer: 59 | Admitting: Internal Medicine

## 2020-10-17 VITALS — BP 112/76 | HR 87 | Temp 96.5°F | Ht 66.0 in | Wt 148.6 lb

## 2020-10-17 DIAGNOSIS — Z0000 Encounter for general adult medical examination without abnormal findings: Secondary | ICD-10-CM | POA: Diagnosis not present

## 2020-10-17 DIAGNOSIS — E559 Vitamin D deficiency, unspecified: Secondary | ICD-10-CM | POA: Diagnosis not present

## 2020-10-17 DIAGNOSIS — Z124 Encounter for screening for malignant neoplasm of cervix: Secondary | ICD-10-CM | POA: Diagnosis not present

## 2020-10-17 DIAGNOSIS — M810 Age-related osteoporosis without current pathological fracture: Secondary | ICD-10-CM

## 2020-10-17 DIAGNOSIS — E782 Mixed hyperlipidemia: Secondary | ICD-10-CM | POA: Diagnosis not present

## 2020-10-17 DIAGNOSIS — I10 Essential (primary) hypertension: Secondary | ICD-10-CM | POA: Diagnosis not present

## 2020-10-17 LAB — COMPREHENSIVE METABOLIC PANEL
ALT: 21 U/L (ref 0–35)
AST: 18 U/L (ref 0–37)
Albumin: 4.6 g/dL (ref 3.5–5.2)
Alkaline Phosphatase: 42 U/L (ref 39–117)
BUN: 25 mg/dL — ABNORMAL HIGH (ref 6–23)
CO2: 29 mEq/L (ref 19–32)
Calcium: 10.3 mg/dL (ref 8.4–10.5)
Chloride: 99 mEq/L (ref 96–112)
Creatinine, Ser: 0.72 mg/dL (ref 0.40–1.20)
GFR: 88.34 mL/min (ref >60.00)
Glucose, Bld: 85 mg/dL (ref 70–99)
Potassium: 3.8 mEq/L (ref 3.5–5.1)
Sodium: 138 mEq/L (ref 135–145)
Total Bilirubin: 0.8 mg/dL (ref 0.2–1.2)
Total Protein: 7.1 g/dL (ref 6.0–8.3)

## 2020-10-17 LAB — LIPID PANEL
Cholesterol: 291 mg/dL — ABNORMAL HIGH (ref 0–200)
HDL: 105.3 mg/dL (ref >39.00)
LDL Cholesterol: 168 mg/dL — ABNORMAL HIGH (ref 0–99)
NonHDL: 185.62
Total CHOL/HDL Ratio: 3
Triglycerides: 90 mg/dL (ref 0.0–149.0)
VLDL: 18 mg/dL (ref 0.0–40.0)

## 2020-10-17 LAB — TSH: TSH: 1.48 u[IU]/mL (ref 0.35–5.50)

## 2020-10-17 LAB — VITAMIN D 25 HYDROXY (VIT D DEFICIENCY, FRACTURES): VITD: 37.95 ng/mL (ref 30.00–100.00)

## 2020-10-17 NOTE — Assessment & Plan Note (Signed)

## 2020-10-17 NOTE — Assessment & Plan Note (Signed)
Well controlled on current regimen. Renal function due.,   no changes today. 

## 2020-10-17 NOTE — Progress Notes (Signed)
Patient ID: Karen Odom, female    DOB: 05/23/1955  Age: 65 y.o. MRN: [id number]  The patient is here for annual preventive  examination and management of other chronic and acute problems.  This visit occurred during the SARS-CoV-2 public health emergency.  Safety protocols were in place, including screening questions prior to the visit, additional usage of staff PPE, and extensive cleaning of exam room while observing appropriate contact time as indicated for disinfecting solutions.     The risk factors are reflected in the social history.  The roster of all physicians providing medical care to patient - is listed in the Snapshot section of the chart.  Activities of daily living:  The patient is 100% independent in all ADLs: dressing, toileting, feeding as well as independent mobility  Home safety : The patient has smoke detectors in the home. They wear seatbelts.  There are no firearms at home. There is no violence in the home.   There is no risks for hepatitis, STDs or HIV. There is no   history of blood transfusion. They have no travel history to infectious disease endemic areas of the world.  The patient has seen their dentist in the last six month. They have seen their eye doctor in the last year. They admit to slight hearing difficulty with regard to whispered voices and some television programs.  They have deferred audiologic testing in the last year.  They do not  have excessive sun exposure. Discussed the need for sun protection: hats, long sleeves and use of sunscreen if there is significant sun exposure.   Diet: the importance of a healthy diet is discussed. They do have a healthy diet.  The benefits of regular aerobic exercise were discussed. She walks 4 times per week ,  20 minutes.   Depression screen: there are no signs or vegative symptoms of depression- irritability, change in appetite, anhedonia, sadness/tearfullness.  Cognitive assessment: the patient manages all their  financial and personal affairs and is actively engaged. They could relate day,date,year and events; recalled 2/3 objects at 3 minutes; performed clock-face test normally.  The following portions of the patient's history were reviewed and updated as appropriate: allergies, current medications, past family history, past medical history,  past surgical history, past social history  and problem list.  Visual acuity was not assessed per patient preference since she has regular follow up with her ophthalmologist. Hearing and body mass index were assessed and reviewed.   During the course of the visit the patient was educated and counseled about appropriate screening and preventive services including : fall prevention , diabetes screening, nutrition counseling, colorectal cancer screening, and recommended immunizations.    CC: The primary encounter diagnosis was Vitamin D deficiency. Diagnoses of Essential hypertension, Cervical cancer screening, Moderate mixed hyperlipidemia not requiring statin therapy, Osteoporosis of forearm, Essential hypertension, benign, and Visit for preventive health examination were also pertinent to this visit.  1) endoscopy rn  2) htn not checking  History Wandra has a past medical history of Chicken pox, Dental fistula (10/01/2016), HPV test positive (2004), Hyperlipidemia, and Hypertension.   She has a past surgical history that includes Abdominal hysterectomy; Colonoscopy with propofol (N/A, 10/14/2017); polypectomy (N/A, 10/14/2017); and Breast excisional biopsy (Bilateral).   Her family history includes Alcohol abuse in her father; Diabetes in her father; Early death in her father; Heart disease in her father; Hyperlipidemia in her father; Hypertension in her father.She reports that she quit smoking about 29 years ago. Her smoking use  included cigarettes. She smoked an average of 1 pack per day. She has never used smokeless tobacco. She reports current alcohol use. She  reports that she does not use drugs.  Outpatient Medications Prior to Visit  Medication Sig Dispense Refill   hydrochlorothiazide (HYDRODIURIL) 25 MG tablet TAKE 1 TABLET BY MOUTH DAILY. 90 tablet 3   telmisartan (MICARDIS) 80 MG tablet TAKE 1 TABLET BY MOUTH AT BEDTIME. 90 tablet 1   ergocalciferol (DRISDOL) 1.25 MG (50000 UT) capsule Take 1 capsule (50,000 Units total) by mouth once a week. (Patient not taking: Reported on 10/17/2020) 4 capsule 0   Vitamin D, Ergocalciferol, (DRISDOL) 1.25 MG (50000 UNIT) CAPS capsule TAKE 1 CAPSULE BY MOUTH ONCE A WEEK. (Patient not taking: Reported on 10/17/2020) 4 capsule 0   No facility-administered medications prior to visit.    Review of Systems  Patient denies headache, fevers, malaise, unintentional weight loss, skin rash, eye pain, sinus congestion and sinus pain, sore throat, dysphagia,  hemoptysis , cough, dyspnea, wheezing, chest pain, palpitations, orthopnea, edema, abdominal pain, nausea, melena, diarrhea, constipation, flank pain, dysuria, hematuria, urinary  Frequency, nocturia, numbness, tingling, seizures,  Focal weakness, Loss of consciousness,  Tremor, insomnia, depression, anxiety, and suicidal ideation.     Objective:  BP 112/76 (BP Location: Left Arm, Patient Position: Sitting, Cuff Size: Normal)   Pulse 87   Temp (!) 96.5 F (35.8 C) (Temporal)   Ht 5\' 6"  (1.676 m)   Wt 148 lb 9.6 oz (67.4 kg)   SpO2 98%   BMI 23.98 kg/m   Physical Exam . General Appearance:    Alert, cooperative, no distress, appears stated age  Head:    Normocephalic, without obvious abnormality, atraumatic  Eyes:    PERRL, conjunctiva/corneas clear, EOM's intact, fundi    benign, both eyes  Ears:    Normal TM's and external ear canals, both ears  Nose:   Nares normal, septum midline, mucosa normal, no drainage    or sinus tenderness  Throat:   Lips, mucosa, and tongue normal; teeth and gums normal  Neck:   Supple, symmetrical, trachea midline, no  adenopathy;    thyroid:  no enlargement/tenderness/nodules; no carotid   bruit or JVD  Back:     Symmetric, no curvature, ROM normal, no CVA tenderness  Lungs:     Clear to auscultation bilaterally, respirations unlabored  Chest Wall:    No tenderness or deformity   Heart:    Regular rate and rhythm, S1 and S2 normal, no murmur, rub   or gallop  Breast Exam:    No tenderness, masses, or nipple abnormality  Abdomen:     Soft, non-tender, bowel sounds active all four quadrants,    no masses, no organomegaly  Genitalia:    Pelvic: cervix atrophic , external genitalia normal, no adnexal masses or tenderness,  rectovaginal septum normal, uterus surgically  absent and vagina normal without discharge  Extremities:   Extremities normal, atraumatic, no cyanosis or edema  Pulses:   2+ and symmetric all extremities  Skin:   Skin color, texture, turgor normal, no rashes or lesions  Lymph nodes:   Cervical, supraclavicular, and axillary nodes normal  Neurologic:   CNII-XII intact, normal strength, sensation and reflexes    throughout     Assessment & Plan:   Problem List Items Addressed This Visit       Unprioritized   Essential hypertension, benign    Well controlled on current regimen. Renal function due, no changes today.  Osteoporosis of forearm    She declines therapy given  The mild osteopenia in other sites.   Advised to continue exercise, checking vitamin D and repeat DEXA in 2023      Visit for preventive health examination    age appropriate education and counseling updated, referrals for preventative services and immunizations addressed, dietary and smoking counseling addressed, most recent labs reviewed.  I have personally reviewed and have noted:   1) the patient's medical and social history 2) The pt's use of alcohol, tobacco, and illicit drugs 3) The patient's current medications and supplements 4) Functional ability including ADL's, fall risk, home safety risk, hearing  and visual impairment 5) Diet and physical activities 6) Evidence for depression or mood disorder 7) The patient's height, weight, and BMI have been recorded in the chart   I have made referrals, and provided counseling and education based on review of the above      Vitamin D deficiency - Primary   Relevant Orders   VITAMIN D 25 Hydroxy (Vit-D Deficiency, Fractures)   Other Visit Diagnoses     Essential hypertension       Relevant Orders   Comprehensive metabolic panel   Cervical cancer screening       Relevant Orders   Cytology - PAP   Moderate mixed hyperlipidemia not requiring statin therapy       Relevant Orders   Lipid panel   TSH       I have discontinued Almyra Free T. Jeanlouis's ergocalciferol and Vitamin D (Ergocalciferol). I am also having her maintain her hydrochlorothiazide and telmisartan.  No orders of the defined types were placed in this encounter.   Medications Discontinued During This Encounter  Medication Reason   ergocalciferol (DRISDOL) 1.25 MG (50000 UT) capsule Completed Course   Vitamin D, Ergocalciferol, (DRISDOL) 1.25 MG (50000 UNIT) CAPS capsule     Follow-up: No follow-ups on file.   Crecencio Mc, MD

## 2020-10-17 NOTE — Assessment & Plan Note (Signed)
She declines therapy given  The mild osteopenia in other sites.   Advised to continue exercise, checking vitamin D and repeat DEXA in 2023

## 2020-10-17 NOTE — Patient Instructions (Signed)
We will repeat your DEXA in 2023.  No more PAP smears needed unless HPV is positive

## 2020-10-19 NOTE — Addendum Note (Signed)
Addended by: Crecencio Mc on: 10/19/2020 07:35 PM   Modules accepted: Orders

## 2020-10-20 LAB — CYTOLOGY - PAP
Comment: NEGATIVE
Diagnosis: NEGATIVE
High risk HPV: NEGATIVE

## 2020-12-07 ENCOUNTER — Encounter: Payer: Self-pay | Admitting: Internal Medicine

## 2020-12-09 DIAGNOSIS — H5203 Hypermetropia, bilateral: Secondary | ICD-10-CM | POA: Diagnosis not present

## 2021-01-07 ENCOUNTER — Other Ambulatory Visit: Payer: Self-pay | Admitting: Internal Medicine

## 2021-01-07 ENCOUNTER — Other Ambulatory Visit: Payer: Self-pay

## 2021-01-07 MED FILL — Hydrochlorothiazide Tab 25 MG: ORAL | 90 days supply | Qty: 90 | Fill #0 | Status: AC

## 2021-01-07 MED FILL — Telmisartan Tab 80 MG: ORAL | 90 days supply | Qty: 90 | Fill #1 | Status: AC

## 2021-01-08 ENCOUNTER — Other Ambulatory Visit: Payer: Self-pay

## 2021-01-09 ENCOUNTER — Other Ambulatory Visit: Payer: Self-pay

## 2021-04-06 ENCOUNTER — Other Ambulatory Visit: Payer: Self-pay

## 2021-04-06 ENCOUNTER — Other Ambulatory Visit: Payer: Self-pay | Admitting: Internal Medicine

## 2021-04-06 MED ORDER — TELMISARTAN 80 MG PO TABS
ORAL_TABLET | Freq: Every day | ORAL | 1 refills | Status: DC
  Filled 2021-04-06: qty 90, 90d supply, fill #0
  Filled 2021-06-29: qty 90, 90d supply, fill #1

## 2021-04-06 MED FILL — Hydrochlorothiazide Tab 25 MG: ORAL | 90 days supply | Qty: 90 | Fill #1 | Status: AC

## 2021-06-29 ENCOUNTER — Other Ambulatory Visit: Payer: Self-pay

## 2021-06-29 MED FILL — Hydrochlorothiazide Tab 25 MG: ORAL | 90 days supply | Qty: 90 | Fill #2 | Status: AC

## 2021-07-01 ENCOUNTER — Other Ambulatory Visit: Payer: Self-pay

## 2021-07-01 ENCOUNTER — Ambulatory Visit
Admission: EM | Admit: 2021-07-01 | Discharge: 2021-07-01 | Disposition: A | Discharge status: Home / Self Care | Payer: 59 | Attending: Emergency Medicine | Admitting: Emergency Medicine

## 2021-07-01 DIAGNOSIS — T23232A Burn of second degree of multiple left fingers (nail), not including thumb, initial encounter: Secondary | ICD-10-CM

## 2021-07-01 MED ORDER — SILVER SULFADIAZINE 1 % EX CREA
1.0000 "application " | TOPICAL_CREAM | Freq: Every day | CUTANEOUS | 0 refills | Status: DC
  Filled 2021-07-01: qty 50, 5d supply, fill #0

## 2021-07-01 NOTE — ED Triage Notes (Signed)
Pt present left hand/finger burn to the middle and ring fingers. Incident happen on Saturday when patient was removing food from the oven.

## 2021-07-01 NOTE — ED Provider Notes (Signed)
[facility]    CSN: [id number] Arrival date & time: 07/01/21  1436      History   Chief Complaint Chief Complaint  Patient presents with   Burn    HPI Karen Odom is a 66 y.o. female.   Patient presents to burn to the third fourth and fifth finger of the left hand occurring 5 days ago.  Endorses that she was cooking with a flavored probable when she burned her hand on the stove.  Mild discomfort with flexion of fingers.  Blistering present.  Range of Motion intact.  Has not attempted treatment.  Denies fever, chills or drainage.  Past Medical History:  Diagnosis Date   Chicken pox    Dental fistula 10/01/2016   HPV test positive 2004   Hyperlipidemia    Hypertension     Patient Active Problem List   Diagnosis Date Noted   Vitamin D deficiency 10/20/2019   Dyslipidemia 10/20/2019   Dupuytren's contracture of right hand 10/18/2019   Encounter for screening colonoscopy    Benign neoplasm of ascending colon    Lichen sclerosus et atrophicus of the vulva 10/02/2017   Vulvar lesion 10/01/2016   History of corneal ulcer 10/01/2016   Screening for cervical cancer 08/19/2013   Visit for preventive health examination 08/19/2013   Osteoporosis of forearm 03/12/2013   Essential hypertension, benign 07/25/2012    Past Surgical History:  Procedure Laterality Date   ABDOMINAL HYSTERECTOMY     BREAST EXCISIONAL BIOPSY Bilateral    2 x right breast 1 X left breast negative [phone]   COLONOSCOPY WITH PROPOFOL N/A 10/14/2017   Procedure: COLONOSCOPY WITH PROPOFOL WITH BIOPSY;  Surgeon: Lucilla Lame, MD;  Location: [place];  Service: Endoscopy;  Laterality: N/A;  Requests early   POLYPECTOMY N/A 10/14/2017   Procedure: POLYPECTOMY;  Surgeon: Lucilla Lame, MD;  Location: [place];  Service: Endoscopy;  Laterality: N/A;    OB History   No obstetric history on file.      Home Medications    Prior to Admission medications    Medication Sig Start Date End Date Taking? Authorizing Provider  silver sulfADIAZINE (SILVADENE) 1 % cream Apply 1 application  topically daily. 07/01/21  Yes Jaydynn Wolford R, NP  hydrochlorothiazide (HYDRODIURIL) 25 MG tablet TAKE 1 TABLET BY MOUTH DAILY. 01/07/21 01/07/22  Crecencio Mc, MD  telmisartan (MICARDIS) 80 MG tablet TAKE 1 TABLET BY MOUTH AT BEDTIME. 04/06/21 04/06/22  Crecencio Mc, MD    Family History Family History  Problem Relation Age of Onset   Alcohol abuse Father    Heart disease Father    Hyperlipidemia Father    Hypertension Father    Early death Father    Diabetes Father    Breast cancer Neg Hx     Social History Social History   Tobacco Use   Smoking status: Former    Packs/day: 1.00    Types: Cigarettes    Quit date: 07/26/1991    Years since quitting: 29.9   Smokeless tobacco: Never  Substance Use Topics   Alcohol use: Yes   Drug use: No     Allergies   Patient has no known allergies.   Review of Systems Review of Systems  Constitutional: Negative.   Respiratory: Negative.    Cardiovascular: Negative.   Skin:  Positive for wound. Negative for color change, pallor and rash.  Neurological: Negative.      Physical Exam Triage Vital Signs ED  Triage Vitals  Enc Vitals Group     BP 07/01/21 1453 (!) 153/101     Pulse Rate 07/01/21 1453 63     Resp 07/01/21 1453 18     Temp 07/01/21 1453 98.4 F (36.9 C)     Temp Source 07/01/21 1453 Oral     SpO2 07/01/21 1453 99 %     Weight --      Height --      Head Circumference --      Peak Flow --      Pain Score 07/01/21 1452 7     Pain Loc --      Pain Edu? --      Excl. in [place]? --    No data found.  Updated Vital Signs BP (!) 153/101 (BP Location: Left Arm)   Pulse 63   Temp 98.4 F (36.9 C) (Oral)   Resp 18   SpO2 99%   Visual Acuity Right Eye Distance:   Left Eye Distance:   Bilateral Distance:    Right Eye Near:   Left Eye Near:    Bilateral Near:     Physical  Exam Constitutional:      Appearance: Normal appearance.  HENT:     Head: Normocephalic.  Eyes:     Extraocular Movements: Extraocular movements intact.  Pulmonary:     Effort: Pulmonary effort is normal.  Musculoskeletal:     Comments: Range of motion of the third fourth and fifth fingers intact, able to, sensation intact, capillary refill less than 3  Skin:    Comments: Defer to photo  Neurological:     Mental Status: She is alert and oriented to person, place, and time. Mental status is at baseline.  Psychiatric:        Mood and Affect: Mood normal.        Behavior: Behavior normal.       UC Treatments / Results  Labs (all labs ordered are listed, but only abnormal results are displayed) Labs Reviewed - No data to display  EKG   Radiology No results found.  Procedures Procedures (including critical care time)  Medications Ordered in UC Medications - No data to display  Initial Impression / Assessment and Plan / UC Course  I have reviewed the triage vital signs and the nursing notes.  Pertinent labs & imaging results that were available during my care of the patient were reviewed by me and considered in my medical decision making (see chart for details).    Partial-thickness burn of multiple fingers of left hand excluding thumb, initial encounter  Burn is attempting to heal, blistering present but still intact, scant serous fluid from the third middle finger blister, prescribe Silvadene to be used daily to help facilitate healing, patient given precautions and will write if no improvement seen she is to follow-up with her PCP, also given walker referral to the wound center for evaluation and management due to placement of the burn, given strict precautions for signs of infection to return for evaluation Final Clinical Impressions(s) / UC Diagnoses   Final diagnoses:  None   Discharge Instructions   None    ED Prescriptions     Medication Sig Dispense  Auth. Provider   silver sulfADIAZINE (SILVADENE) 1 % cream Apply 1 application  topically daily. 50 g Hans Eden, NP      PDMP not reviewed this encounter.   Hans Eden, NP 07/01/21 1550

## 2021-07-01 NOTE — Discharge Instructions (Signed)
Karen Odom should continue to heal without complication, you may experience stiffness or discomfort as area scabs over due to movement of the hand  You may apply Silvadene cream over the affected areas daily after cleansing with diluted soapy water  Do not burst blisters as this increases your risk for infection, if they begin to drain on their own that is okay  Please watch for signs of infection such as increased swelling, increased pain, new redness, fever, chills, puslike drainage or further limitations of movement, if this occurs at any point please follow-up with urgent care as needed  If you do not see improvements with the wound in 1 week please follow-up with the wound care center for further evaluation and management, information is listed on the front page

## 2021-08-04 ENCOUNTER — Encounter: Payer: Self-pay | Admitting: Internal Medicine

## 2021-08-04 DIAGNOSIS — Z1231 Encounter for screening mammogram for malignant neoplasm of breast: Secondary | ICD-10-CM

## 2021-08-04 DIAGNOSIS — Z780 Asymptomatic menopausal state: Secondary | ICD-10-CM

## 2021-08-05 ENCOUNTER — Other Ambulatory Visit: Payer: Self-pay | Admitting: Internal Medicine

## 2021-08-05 DIAGNOSIS — Z1231 Encounter for screening mammogram for malignant neoplasm of breast: Secondary | ICD-10-CM

## 2021-08-18 DIAGNOSIS — L271 Localized skin eruption due to drugs and medicaments taken internally: Secondary | ICD-10-CM | POA: Diagnosis not present

## 2021-08-18 DIAGNOSIS — D485 Neoplasm of uncertain behavior of skin: Secondary | ICD-10-CM | POA: Diagnosis not present

## 2021-08-18 DIAGNOSIS — D225 Melanocytic nevi of trunk: Secondary | ICD-10-CM | POA: Diagnosis not present

## 2021-08-18 DIAGNOSIS — L578 Other skin changes due to chronic exposure to nonionizing radiation: Secondary | ICD-10-CM | POA: Diagnosis not present

## 2021-08-18 DIAGNOSIS — Z86018 Personal history of other benign neoplasm: Secondary | ICD-10-CM | POA: Diagnosis not present

## 2021-08-27 DIAGNOSIS — M720 Palmar fascial fibromatosis [Dupuytren]: Secondary | ICD-10-CM | POA: Diagnosis not present

## 2021-09-08 ENCOUNTER — Ambulatory Visit
Admission: RE | Admit: 2021-09-08 | Discharge: 2021-09-08 | Disposition: A | Discharge status: Home / Self Care | Payer: 59 | Source: Ambulatory Visit | Attending: Internal Medicine | Admitting: Internal Medicine

## 2021-09-08 DIAGNOSIS — Z13820 Encounter for screening for osteoporosis: Secondary | ICD-10-CM | POA: Insufficient documentation

## 2021-09-08 DIAGNOSIS — M8589 Other specified disorders of bone density and structure, multiple sites: Secondary | ICD-10-CM | POA: Diagnosis not present

## 2021-09-08 DIAGNOSIS — Z780 Asymptomatic menopausal state: Secondary | ICD-10-CM | POA: Diagnosis not present

## 2021-09-24 ENCOUNTER — Ambulatory Visit
Admission: RE | Admit: 2021-09-24 | Discharge: 2021-09-24 | Disposition: A | Discharge status: Home / Self Care | Payer: 59 | Source: Ambulatory Visit | Attending: Internal Medicine | Admitting: Internal Medicine

## 2021-09-24 DIAGNOSIS — Z1231 Encounter for screening mammogram for malignant neoplasm of breast: Secondary | ICD-10-CM | POA: Insufficient documentation

## 2021-09-25 ENCOUNTER — Other Ambulatory Visit: Payer: Self-pay

## 2021-09-25 ENCOUNTER — Other Ambulatory Visit: Payer: Self-pay | Admitting: Internal Medicine

## 2021-09-25 MED FILL — Hydrochlorothiazide Tab 25 MG: ORAL | 90 days supply | Qty: 90 | Fill #3 | Status: AC

## 2021-09-25 MED FILL — Telmisartan Tab 80 MG: ORAL | 90 days supply | Qty: 90 | Fill #0 | Status: AC

## 2021-09-25 NOTE — Telephone Encounter (Signed)
Spoke to Patient to let her know that the Telmisartan will refill on 10/06/21. Patient stated she is getting low and will let us know if she runs out.

## 2021-09-28 ENCOUNTER — Other Ambulatory Visit: Payer: Self-pay

## 2021-09-29 DIAGNOSIS — D235 Other benign neoplasm of skin of trunk: Secondary | ICD-10-CM | POA: Diagnosis not present

## 2021-09-29 DIAGNOSIS — D225 Melanocytic nevi of trunk: Secondary | ICD-10-CM | POA: Diagnosis not present

## 2021-10-01 ENCOUNTER — Other Ambulatory Visit: Payer: Self-pay

## 2021-10-01 MED ORDER — PENICILLIN V POTASSIUM 500 MG PO TABS
ORAL_TABLET | ORAL | 0 refills | Status: DC
  Filled 2021-10-01: qty 28, 7d supply, fill #0

## 2021-10-16 ENCOUNTER — Other Ambulatory Visit: Payer: Self-pay

## 2021-10-22 ENCOUNTER — Ambulatory Visit (INDEPENDENT_AMBULATORY_CARE_PROVIDER_SITE_OTHER): Payer: 59 | Admitting: Internal Medicine

## 2021-10-22 ENCOUNTER — Encounter: Payer: Self-pay | Admitting: Internal Medicine

## 2021-10-22 VITALS — BP 104/70 | HR 82 | Temp 97.4°F | Ht 66.0 in | Wt 142.8 lb

## 2021-10-22 DIAGNOSIS — R7301 Impaired fasting glucose: Secondary | ICD-10-CM

## 2021-10-22 DIAGNOSIS — E871 Hypo-osmolality and hyponatremia: Secondary | ICD-10-CM

## 2021-10-22 DIAGNOSIS — Z0000 Encounter for general adult medical examination without abnormal findings: Secondary | ICD-10-CM | POA: Diagnosis not present

## 2021-10-22 DIAGNOSIS — M8580 Other specified disorders of bone density and structure, unspecified site: Secondary | ICD-10-CM

## 2021-10-22 DIAGNOSIS — D75839 Thrombocytosis, unspecified: Secondary | ICD-10-CM

## 2021-10-22 DIAGNOSIS — I10 Essential (primary) hypertension: Secondary | ICD-10-CM

## 2021-10-22 DIAGNOSIS — Z124 Encounter for screening for malignant neoplasm of cervix: Secondary | ICD-10-CM

## 2021-10-22 DIAGNOSIS — Z780 Asymptomatic menopausal state: Secondary | ICD-10-CM | POA: Diagnosis not present

## 2021-10-22 DIAGNOSIS — R5383 Other fatigue: Secondary | ICD-10-CM | POA: Diagnosis not present

## 2021-10-22 DIAGNOSIS — E785 Hyperlipidemia, unspecified: Secondary | ICD-10-CM | POA: Diagnosis not present

## 2021-10-22 LAB — COMPREHENSIVE METABOLIC PANEL
ALT: 27 U/L (ref 0–35)
AST: 23 U/L (ref 0–37)
Albumin: 4.7 g/dL (ref 3.5–5.2)
Alkaline Phosphatase: 55 U/L (ref 39–117)
BUN: 18 mg/dL (ref 6–23)
CO2: 31 mEq/L (ref 19–32)
Calcium: 10.8 mg/dL — ABNORMAL HIGH (ref 8.4–10.5)
Chloride: 94 mEq/L — ABNORMAL LOW (ref 96–112)
Creatinine, Ser: 0.74 mg/dL (ref 0.40–1.20)
GFR: 84.88 mL/min (ref >60.00)
Glucose, Bld: 96 mg/dL (ref 70–99)
Potassium: 4.6 mEq/L (ref 3.5–5.1)
Sodium: 133 mEq/L — ABNORMAL LOW (ref 135–145)
Total Bilirubin: 0.5 mg/dL (ref 0.2–1.2)
Total Protein: 7.3 g/dL (ref 6.0–8.3)

## 2021-10-22 LAB — LIPID PANEL
Cholesterol: 269 mg/dL — ABNORMAL HIGH (ref 0–200)
HDL: 126.9 mg/dL (ref >39.00)
LDL Cholesterol: 130 mg/dL — ABNORMAL HIGH (ref 0–99)
NonHDL: 142.17
Total CHOL/HDL Ratio: 2
Triglycerides: 63 mg/dL (ref 0.0–149.0)
VLDL: 12.6 mg/dL (ref 0.0–40.0)

## 2021-10-22 LAB — CBC WITH DIFFERENTIAL/PLATELET
Basophils Absolute: 0.1 10*3/uL (ref 0.0–0.1)
Basophils Relative: 1.1 % (ref 0.0–3.0)
Eosinophils Absolute: 0.1 10*3/uL (ref 0.0–0.7)
Eosinophils Relative: 2.1 % (ref 0.0–5.0)
HCT: 43 % (ref 36.0–46.0)
Hemoglobin: 14.5 g/dL (ref 12.0–15.0)
Lymphocytes Relative: 29.1 % (ref 12.0–46.0)
Lymphs Abs: 1.7 10*3/uL (ref 0.7–4.0)
MCHC: 33.6 g/dL (ref 30.0–36.0)
MCV: 98.9 fl (ref 78.0–100.0)
Monocytes Absolute: 0.6 10*3/uL (ref 0.1–1.0)
Monocytes Relative: 11.1 % (ref 3.0–12.0)
Neutro Abs: 3.2 10*3/uL (ref 1.4–7.7)
Neutrophils Relative %: 56.6 % (ref 43.0–77.0)
Platelets: 416 10*3/uL — ABNORMAL HIGH (ref 150.0–400.0)
RBC: 4.35 Mil/uL (ref 3.87–5.11)
RDW: 13 % (ref 11.5–15.5)
WBC: 5.7 10*3/uL (ref 4.0–10.5)

## 2021-10-22 LAB — HEMOGLOBIN A1C: Hgb A1c MFr Bld: 5.5 % (ref 4.6–6.5)

## 2021-10-22 LAB — TSH: TSH: 1.47 u[IU]/mL (ref 0.35–5.50)

## 2021-10-22 LAB — MICROALBUMIN / CREATININE URINE RATIO
Creatinine,U: 83.8 mg/dL
Microalb Creat Ratio: 2.9 mg/g (ref 0.0–30.0)
Microalb, Ur: 2.4 mg/dL — ABNORMAL HIGH (ref 0.0–1.9)

## 2021-10-22 LAB — LDL CHOLESTEROL, DIRECT: Direct LDL: 122 mg/dL

## 2021-10-22 NOTE — Progress Notes (Addendum)
The patient is here for annual preventive examination and management of other chronic and acute problems.   The risk factors are reflected in the social history.   The roster of all physicians providing medical care to patient - is listed in the Snapshot section of the chart.   Activities of daily living:  The patient is 100% independent in all ADLs: dressing, toileting, feeding as well as independent mobility   Home safety : The patient has smoke detectors in the home. They wear seatbelts.  There are no unsecured firearms at home. There is no violence in the home.    There is no risks for hepatitis, STDs or HIV. There is no   history of blood transfusion. They have no travel history to infectious disease endemic areas of the world.   The patient has seen their dentist in the last six month. They have seen their eye doctor in the last year. The patinet  denies slight hearing difficulty with regard to whispered voices and some television programs.  They have deferred audiologic testing in the last year.  They do not  have excessive sun exposure. Discussed the need for sun protection: hats, long sleeves and use of sunscreen if there is significant sun exposure.    Diet: the importance of a healthy diet is discussed. They do have a healthy diet.   The benefits of regular aerobic exercise were discussed. The patient  exercises  3 to 5 days per week  for  60 minutes.    Depression screen: there are no signs or vegative symptoms of depression- irritability, change in appetite, anhedonia, sadness/tearfullness.   The following portions of the patient's history were reviewed and updated as appropriate: allergies, current medications, past family history, past medical history,  past surgical history, past social history  and problem list.   Visual acuity was not assessed per patient preference since the patient has regular follow up with an  ophthalmologist. Hearing and body mass index were assessed and  reviewed.    During the course of the visit the patient was educated and counseled about appropriate screening and preventive services including : fall prevention , diabetes screening, nutrition counseling, colorectal cancer screening, and recommended immunizations.    Chief Complaint:  No issues.   Review of Symptoms  Patient denies headache, fevers, malaise, unintentional weight loss, skin rash, eye pain, sinus congestion and sinus pain, sore throat, dysphagia,  hemoptysis , cough, dyspnea, wheezing, chest pain, palpitations, orthopnea, edema, abdominal pain, nausea, melena, diarrhea, constipation, flank pain, dysuria, hematuria, urinary  Frequency, nocturia, numbness, tingling, seizures,  Focal weakness, Loss of consciousness,  Tremor, insomnia, depression, anxiety, and suicidal ideation.    Physical Exam:  BP 104/70 (BP Location: Left Arm, Patient Position: Sitting, Cuff Size: Normal)   Pulse 82   Temp (!) 97.4 F (36.3 C) (Oral)   Ht '5\' 6"'$  (1.676 m)   Wt 142 lb 12.8 oz (64.8 kg)   SpO2 98%   BMI 23.05 kg/m    General appearance: alert, cooperative and appears stated age Ears: normal TM's and external ear canals both ears Throat: lips, mucosa, and tongue normal; teeth and gums normal Neck: no adenopathy, no carotid bruit, supple, symmetrical, trachea midline and thyroid not enlarged, symmetric, no tenderness/mass/nodules Back: symmetric, no curvature. ROM normal. No CVA tenderness. Lungs: clear to auscultation bilaterally Heart: regular rate and rhythm, S1, S2 normal, no murmur, click, rub or gallop Abdomen: soft, non-tender; bowel sounds normal; no masses,  no organomegaly Pulses: 2+  and symmetric Skin: Skin color, texture, turgor normal. No rashes or lesions Lymph nodes: Cervical, supraclavicular, and axillary nodes normal.    Assessment and Plan:  Osteopenia after menopause Improvement in T scores from -2.5 to -2.2 without intervention  Continue weight bearing exercise   And increase calcium intake  Essential hypertension, benign Managed with telmisartan with minimal microalbunuria. No changes today  Lab Results  Component Value Date   MICROALBUR 2.4 (H) 10/22/2021   MICROALBUR <0.7 09/29/2016     Lab Results  Component Value Date   CREATININE 0.74 10/22/2021   Lab Results  Component Value Date   NA 133 (L) 10/22/2021   K 4.6 10/22/2021   CL 94 (L) 10/22/2021   CO2 31 10/22/2021     Screening for cervical cancer Will repeat in 2024 and again in 2026   Visit for preventive health examination age appropriate education and counseling updated, referrals for preventative services and immunizations addressed, dietary and smoking counseling addressed, most recent labs reviewed.  I have personally reviewed and have noted:   1) the patient's medical and social history 2) The pt's use of alcohol, tobacco, and illicit drugs 3) The patient's current medications and supplements 4) Functional ability including ADL's, fall risk, home safety risk, hearing and visual impairment 5) Diet and physical activities 6) Evidence for depression or mood disorder 7) The patient's height, weight, and BMI have been recorded in the chart   I have made referrals, and provided counseling and education based on review of the above  Hypercalcemia Etiology unclear   Will repeat with PTH  Thrombocytosis Mild,  Will   repeat in one month Lab Results  Component Value Date   WBC 5.7 10/22/2021   HGB 14.5 10/22/2021   HCT 43.0 10/22/2021   MCV 98.9 10/22/2021   PLT 416.0 (H) 10/22/2021     Updated Medication List Outpatient Encounter Medications as of 10/22/2021  Medication Sig   hydrochlorothiazide (HYDRODIURIL) 25 MG tablet TAKE 1 TABLET BY MOUTH DAILY.   telmisartan (MICARDIS) 80 MG tablet TAKE 1 TABLET BY MOUTH AT BEDTIME.   [DISCONTINUED] penicillin v potassium (VEETID) 500 MG tablet Take 2 tablets by mouth now, then one tablet every 6 hours until gone  (Patient not taking: Reported on 10/22/2021)   [DISCONTINUED] silver sulfADIAZINE (SILVADENE) 1 % cream Apply 1 application  topically daily. (Patient not taking: Reported on 10/22/2021)   No facility-administered encounter medications on file as of 10/22/2021.

## 2021-10-22 NOTE — Assessment & Plan Note (Signed)
Will repeat in 2024 and again in 2026

## 2021-10-22 NOTE — Assessment & Plan Note (Addendum)
Improvement in T scores from -2.5 to -2.2 without intervention  Continue weight bearing exercise  And increase calcium intake

## 2021-10-22 NOTE — Assessment & Plan Note (Signed)

## 2021-10-22 NOTE — Assessment & Plan Note (Addendum)
Managed with telmisartan with minimal microalbunuria. No changes today  Lab Results  Component Value Date   MICROALBUR 2.4 (H) 10/22/2021   MICROALBUR <0.7 09/29/2016     Lab Results  Component Value Date   CREATININE 0.74 10/22/2021   Lab Results  Component Value Date   NA 133 (L) 10/22/2021   K 4.6 10/22/2021   CL 94 (L) 10/22/2021   CO2 31 10/22/2021

## 2021-10-22 NOTE — Patient Instructions (Addendum)
Your T score in the forearm has improved to the "osteopenia range" !     I would still Consider adding calcium to your diet because you need 1200 to 1800 mg daily just to maintain:    8 ounces soy milk daily is a natural source   600 mg  calcium from  "alga2 cal" is also more natural than other suppplements

## 2021-10-24 DIAGNOSIS — D75839 Thrombocytosis, unspecified: Secondary | ICD-10-CM | POA: Insufficient documentation

## 2021-10-24 NOTE — Assessment & Plan Note (Signed)
Etiology unclear   Will repeat with PTH

## 2021-10-24 NOTE — Assessment & Plan Note (Signed)
Mild,  Will   repeat in one month Lab Results  Component Value Date   WBC 5.7 10/22/2021   HGB 14.5 10/22/2021   HCT 43.0 10/22/2021   MCV 98.9 10/22/2021   PLT 416.0 (H) 10/22/2021

## 2021-10-24 NOTE — Addendum Note (Signed)
Addended by: Crecencio Mc on: 10/24/2021 09:45 AM   Modules accepted: Orders

## 2021-12-16 DIAGNOSIS — M720 Palmar fascial fibromatosis [Dupuytren]: Secondary | ICD-10-CM | POA: Diagnosis not present

## 2021-12-18 DIAGNOSIS — M25641 Stiffness of right hand, not elsewhere classified: Secondary | ICD-10-CM | POA: Diagnosis not present

## 2021-12-18 DIAGNOSIS — M720 Palmar fascial fibromatosis [Dupuytren]: Secondary | ICD-10-CM | POA: Diagnosis not present

## 2021-12-23 DIAGNOSIS — M25641 Stiffness of right hand, not elsewhere classified: Secondary | ICD-10-CM | POA: Diagnosis not present

## 2021-12-23 DIAGNOSIS — M720 Palmar fascial fibromatosis [Dupuytren]: Secondary | ICD-10-CM | POA: Diagnosis not present

## 2021-12-25 ENCOUNTER — Other Ambulatory Visit: Payer: Self-pay

## 2021-12-25 ENCOUNTER — Other Ambulatory Visit: Payer: Self-pay | Admitting: Internal Medicine

## 2021-12-25 MED FILL — Telmisartan Tab 80 MG: ORAL | 90 days supply | Qty: 90 | Fill #1 | Status: AC

## 2021-12-25 MED FILL — Hydrochlorothiazide Tab 25 MG: ORAL | 90 days supply | Qty: 90 | Fill #0 | Status: AC

## 2022-01-28 DIAGNOSIS — M720 Palmar fascial fibromatosis [Dupuytren]: Secondary | ICD-10-CM | POA: Diagnosis not present

## 2022-03-24 ENCOUNTER — Other Ambulatory Visit: Payer: Self-pay

## 2022-03-24 ENCOUNTER — Other Ambulatory Visit: Payer: Self-pay | Admitting: Internal Medicine

## 2022-03-24 MED ORDER — TELMISARTAN 80 MG PO TABS
80.0000 mg | ORAL_TABLET | Freq: Every day | ORAL | 1 refills | Status: DC
  Filled 2022-03-24: qty 90, 90d supply, fill #0
  Filled 2022-06-17: qty 90, 90d supply, fill #1

## 2022-03-24 MED FILL — Hydrochlorothiazide Tab 25 MG: ORAL | 90 days supply | Qty: 90 | Fill #1 | Status: AC

## 2022-06-06 IMAGING — MG DIGITAL SCREENING BILAT W/ TOMO W/ CAD
8 series · 8 of 24 positions shown · non-contrast
Comparison: Previous exam(s).

CLINICAL DATA: Screening.

EXAM:
DIGITAL SCREENING BILATERAL MAMMOGRAM WITH TOMO AND CAD

[R MLO synth-2D]
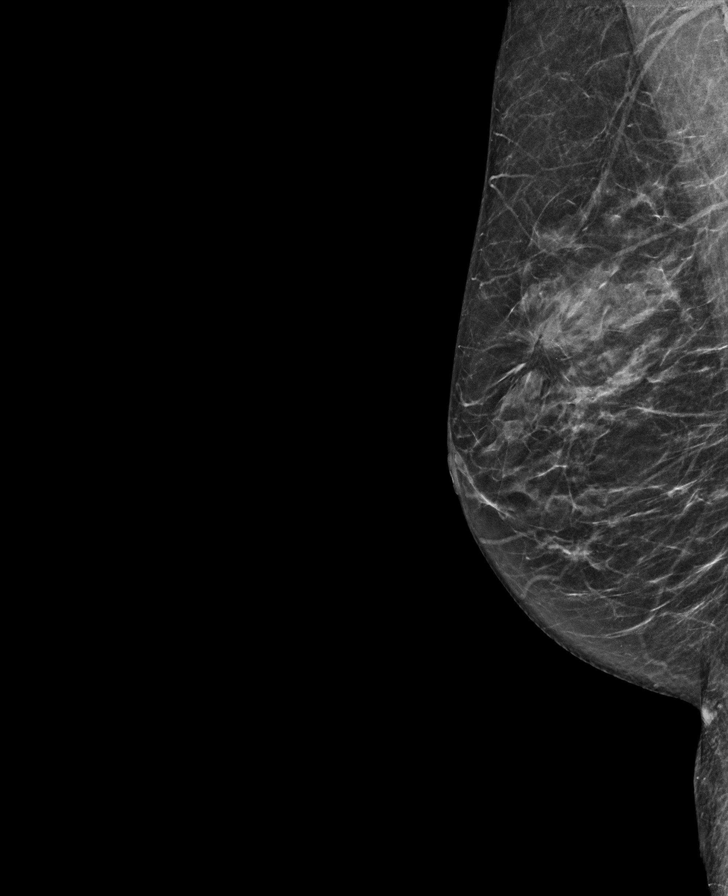

[L MLO synth-2D]
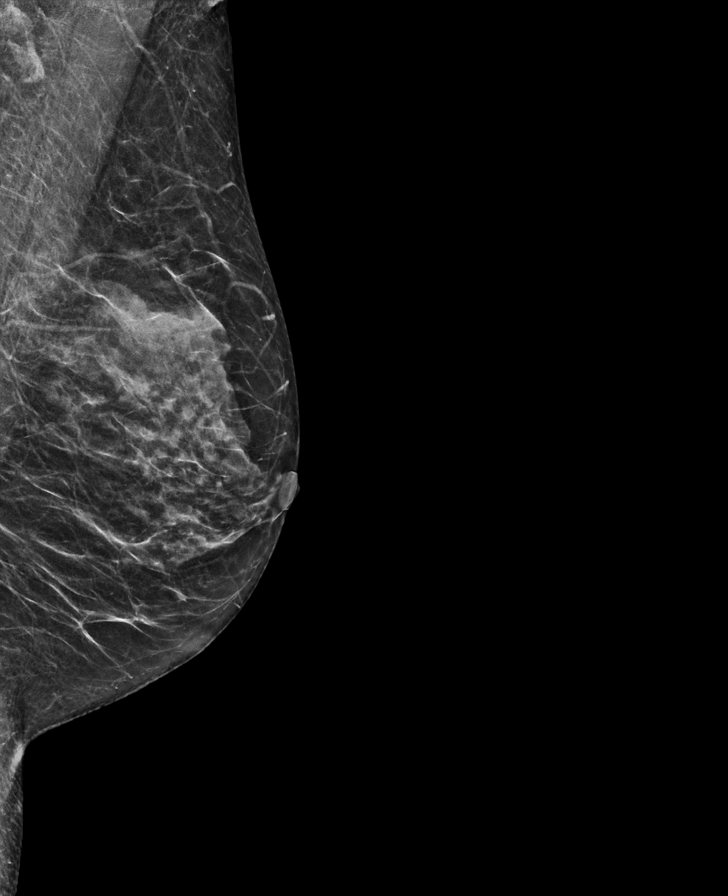

[R CC synth-2D]
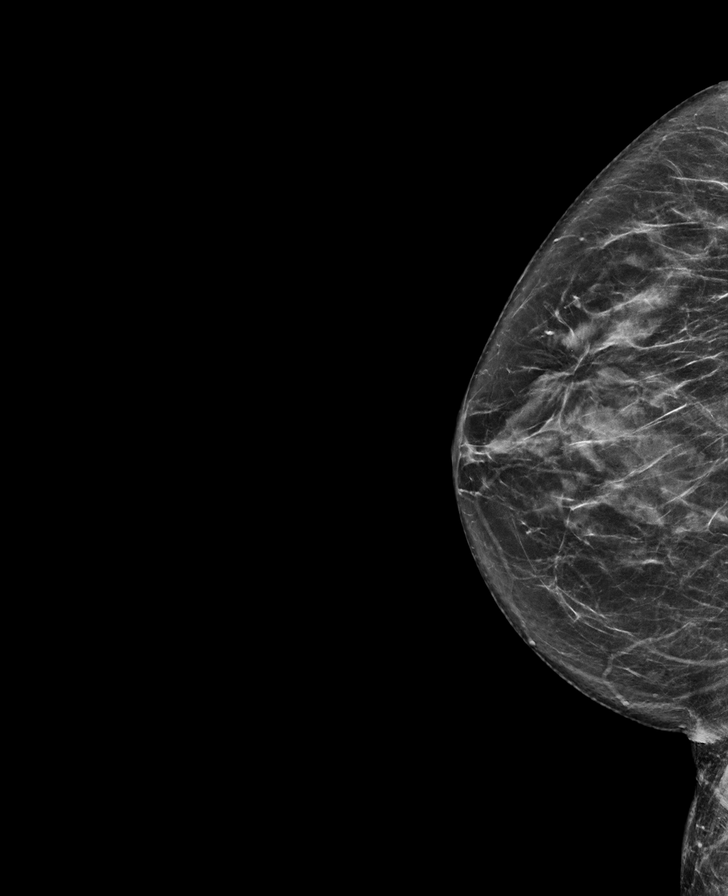

[L CC synth-2D]
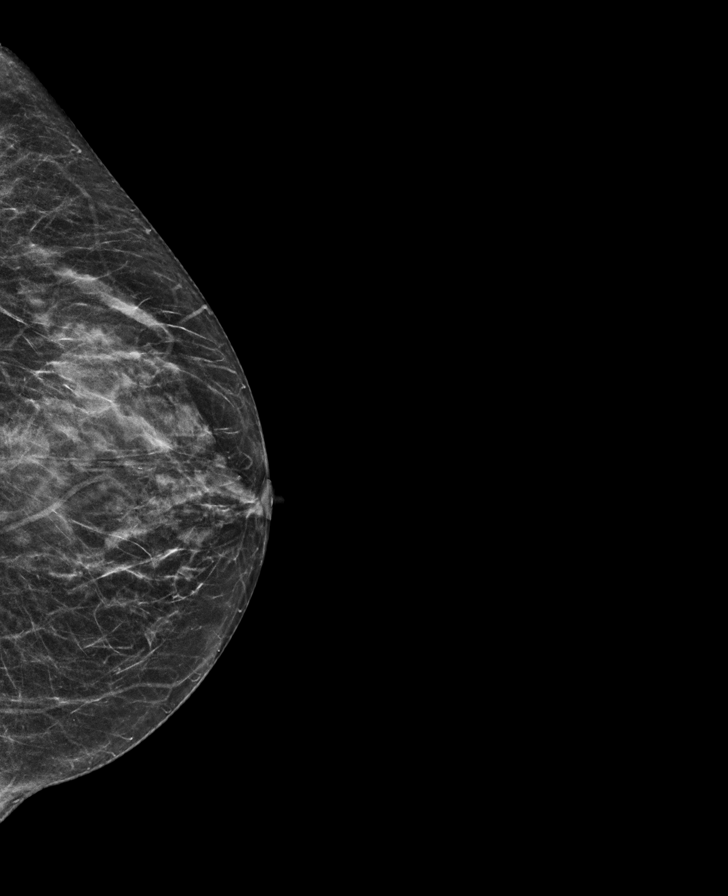

[R CC tomo · tomo slice 35/70.0]
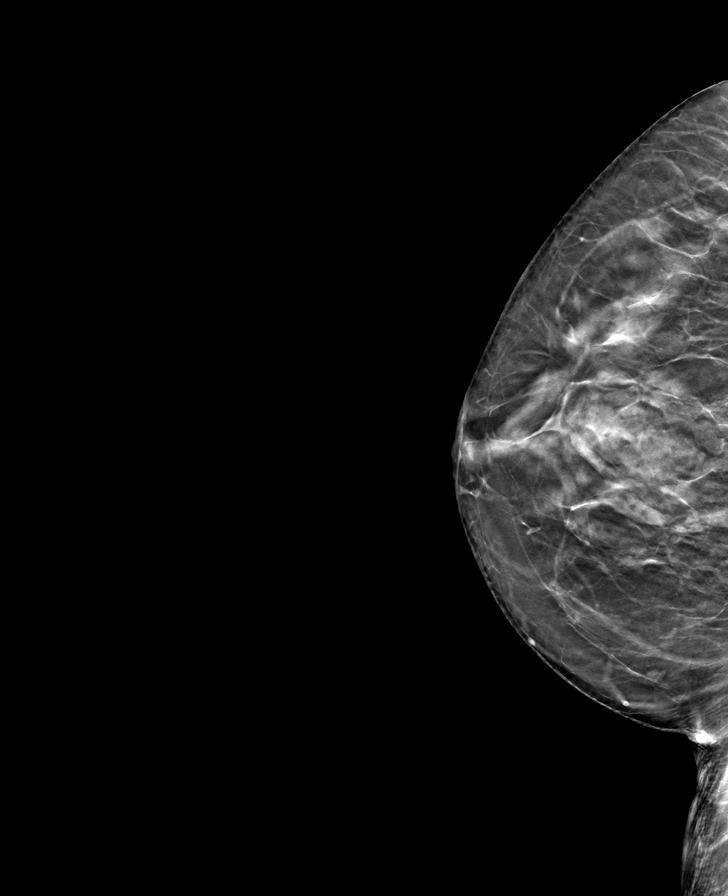

[L MLO tomo · tomo slice 27/53.0]
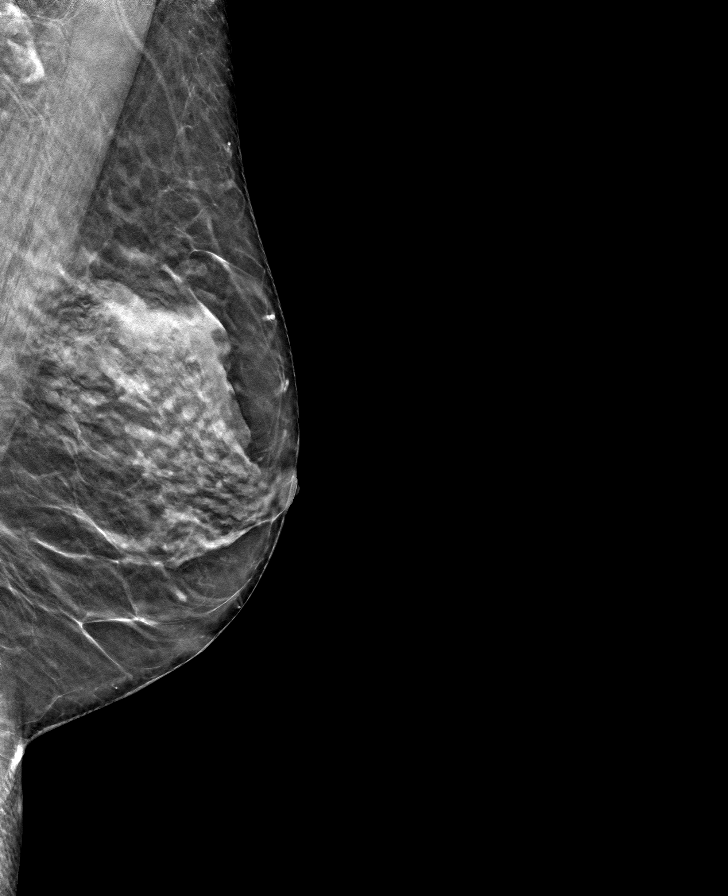

[L CC tomo · tomo slice 25/49.0]
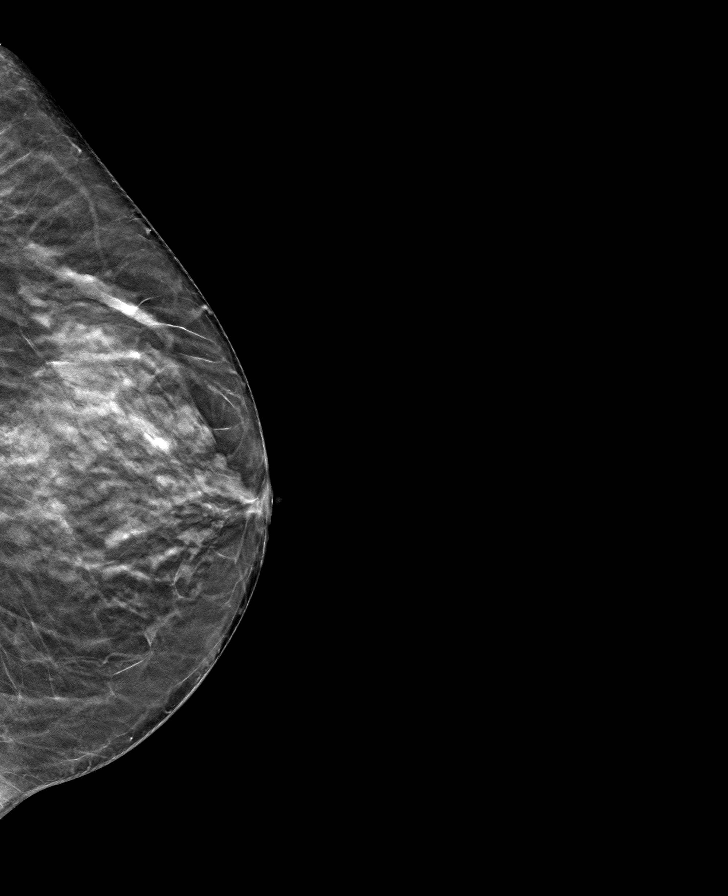

[R MLO tomo · tomo slice 31/61.0]
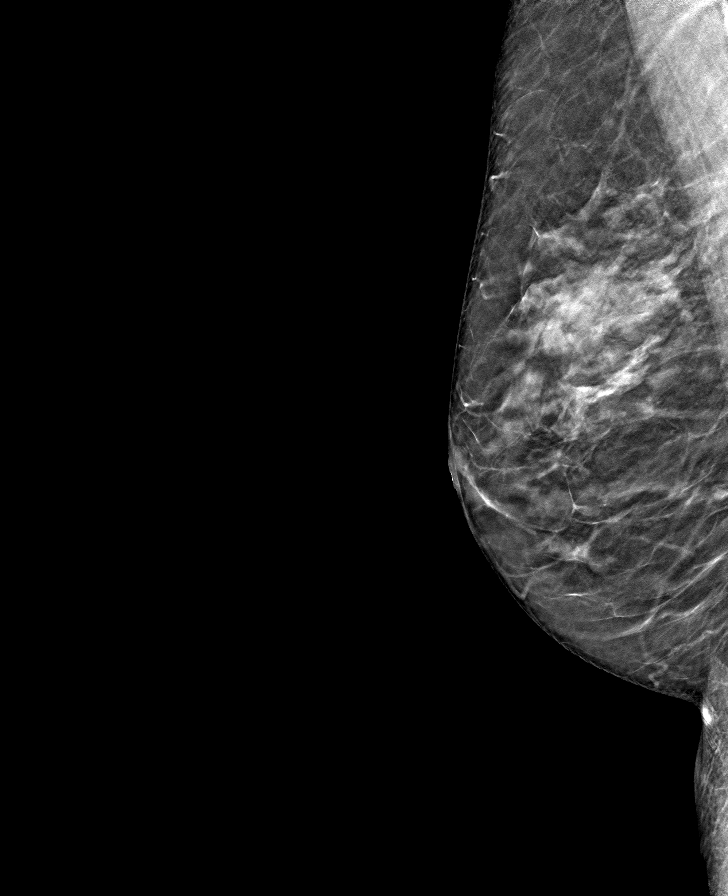

[8 of 24 positions shown; findings below may reference images not displayed]

ACR Breast Density Category c: The breast tissue is heterogeneously
dense, which may obscure small masses.
FINDINGS: There are no findings suspicious for malignancy. Images were
processed with CAD.
IMPRESSION: No mammographic evidence of malignancy. A result letter of this
screening mammogram will be mailed directly to the patient.

RECOMMENDATION:
Screening mammogram in one year. (Code:FT-U-LHB)

BI-RADS CATEGORY  1: Negative.

## 2022-06-17 ENCOUNTER — Other Ambulatory Visit: Payer: Self-pay

## 2022-06-17 MED FILL — Hydrochlorothiazide Tab 25 MG: ORAL | 90 days supply | Qty: 90 | Fill #2 | Status: AC

## 2022-07-26 ENCOUNTER — Other Ambulatory Visit: Payer: Self-pay

## 2022-07-26 MED ORDER — AZITHROMYCIN 500 MG PO TABS
500.0000 mg | ORAL_TABLET | Freq: Every day | ORAL | 0 refills | Status: DC
  Filled 2022-07-26: qty 3, 3d supply, fill #0

## 2022-08-06 ENCOUNTER — Other Ambulatory Visit: Payer: Self-pay

## 2022-08-11 ENCOUNTER — Other Ambulatory Visit: Payer: Self-pay | Admitting: Internal Medicine

## 2022-08-11 DIAGNOSIS — Z1231 Encounter for screening mammogram for malignant neoplasm of breast: Secondary | ICD-10-CM

## 2022-09-14 ENCOUNTER — Other Ambulatory Visit: Payer: Self-pay | Admitting: Internal Medicine

## 2022-09-14 ENCOUNTER — Other Ambulatory Visit: Payer: Self-pay

## 2022-09-14 MED ORDER — TELMISARTAN 80 MG PO TABS
80.0000 mg | ORAL_TABLET | Freq: Every day | ORAL | 1 refills | Status: DC
  Filled 2022-09-14: qty 90, 90d supply, fill #0
  Filled 2022-12-14: qty 90, 90d supply, fill #1

## 2022-09-15 ENCOUNTER — Other Ambulatory Visit: Payer: Self-pay

## 2022-09-21 MED FILL — Hydrochlorothiazide Tab 25 MG: ORAL | 90 days supply | Qty: 90 | Fill #3 | Status: AC

## 2022-09-28 ENCOUNTER — Ambulatory Visit
Admission: RE | Admit: 2022-09-28 | Discharge: 2022-09-28 | Disposition: A | Discharge status: Home / Self Care | Payer: Commercial Managed Care - PPO | Source: Ambulatory Visit | Attending: Internal Medicine | Admitting: Internal Medicine

## 2022-09-28 DIAGNOSIS — Z1231 Encounter for screening mammogram for malignant neoplasm of breast: Secondary | ICD-10-CM | POA: Insufficient documentation

## 2022-10-08 ENCOUNTER — Other Ambulatory Visit: Payer: Self-pay

## 2022-10-08 ENCOUNTER — Other Ambulatory Visit (HOSPITAL_BASED_OUTPATIENT_CLINIC_OR_DEPARTMENT_OTHER): Payer: Self-pay

## 2022-10-08 MED ORDER — AMOXICILLIN-POT CLAVULANATE 875-125 MG PO TABS
1.0000 | ORAL_TABLET | Freq: Two times a day (BID) | ORAL | 0 refills | Status: DC
  Filled 2022-10-08: qty 14, 7d supply, fill #0

## 2022-11-04 ENCOUNTER — Other Ambulatory Visit: Payer: Self-pay

## 2022-11-04 ENCOUNTER — Encounter: Payer: Self-pay | Admitting: Internal Medicine

## 2022-11-04 ENCOUNTER — Ambulatory Visit (INDEPENDENT_AMBULATORY_CARE_PROVIDER_SITE_OTHER): Payer: Commercial Managed Care - PPO | Admitting: Internal Medicine

## 2022-11-04 VITALS — BP 116/68 | HR 70 | Ht 66.0 in | Wt 139.8 lb

## 2022-11-04 DIAGNOSIS — M720 Palmar fascial fibromatosis [Dupuytren]: Secondary | ICD-10-CM

## 2022-11-04 DIAGNOSIS — E785 Hyperlipidemia, unspecified: Secondary | ICD-10-CM

## 2022-11-04 DIAGNOSIS — Z780 Asymptomatic menopausal state: Secondary | ICD-10-CM

## 2022-11-04 DIAGNOSIS — I10 Essential (primary) hypertension: Secondary | ICD-10-CM | POA: Diagnosis not present

## 2022-11-04 DIAGNOSIS — D126 Benign neoplasm of colon, unspecified: Secondary | ICD-10-CM

## 2022-11-04 DIAGNOSIS — D75839 Thrombocytosis, unspecified: Secondary | ICD-10-CM | POA: Diagnosis not present

## 2022-11-04 DIAGNOSIS — M8580 Other specified disorders of bone density and structure, unspecified site: Secondary | ICD-10-CM

## 2022-11-04 DIAGNOSIS — R7301 Impaired fasting glucose: Secondary | ICD-10-CM | POA: Diagnosis not present

## 2022-11-04 DIAGNOSIS — Z0000 Encounter for general adult medical examination without abnormal findings: Secondary | ICD-10-CM

## 2022-11-04 DIAGNOSIS — R5383 Other fatigue: Secondary | ICD-10-CM

## 2022-11-04 DIAGNOSIS — Z1211 Encounter for screening for malignant neoplasm of colon: Secondary | ICD-10-CM

## 2022-11-04 LAB — CBC WITH DIFFERENTIAL/PLATELET
Basophils Absolute: 0.1 10*3/uL (ref 0.0–0.1)
Basophils Relative: 1 % (ref 0.0–3.0)
Eosinophils Absolute: 0.2 10*3/uL (ref 0.0–0.7)
Eosinophils Relative: 2.8 % (ref 0.0–5.0)
HCT: 42.9 % (ref 36.0–46.0)
Hemoglobin: 14 g/dL (ref 12.0–15.0)
Lymphocytes Relative: 34.3 % (ref 12.0–46.0)
Lymphs Abs: 2 10*3/uL (ref 0.7–4.0)
MCHC: 32.7 g/dL (ref 30.0–36.0)
MCV: 102.1 fL — ABNORMAL HIGH (ref 78.0–100.0)
Monocytes Absolute: 0.8 10*3/uL (ref 0.1–1.0)
Monocytes Relative: 13.2 % — ABNORMAL HIGH (ref 3.0–12.0)
Neutro Abs: 2.8 10*3/uL (ref 1.4–7.7)
Neutrophils Relative %: 48.7 % (ref 43.0–77.0)
Platelets: 468 10*3/uL — ABNORMAL HIGH (ref 150.0–400.0)
RBC: 4.21 Mil/uL (ref 3.87–5.11)
RDW: 13.5 % (ref 11.5–15.5)
WBC: 5.8 10*3/uL (ref 4.0–10.5)

## 2022-11-04 LAB — COMPREHENSIVE METABOLIC PANEL
ALT: 18 U/L (ref 0–35)
AST: 19 U/L (ref 0–37)
Albumin: 4.5 g/dL (ref 3.5–5.2)
Alkaline Phosphatase: 49 U/L (ref 39–117)
BUN: 27 mg/dL — ABNORMAL HIGH (ref 6–23)
CO2: 27 meq/L (ref 19–32)
Calcium: 10 mg/dL (ref 8.4–10.5)
Chloride: 102 meq/L (ref 96–112)
Creatinine, Ser: 0.73 mg/dL (ref 0.40–1.20)
GFR: 85.65 mL/min (ref >60.00)
Glucose, Bld: 95 mg/dL (ref 70–99)
Potassium: 4.3 meq/L (ref 3.5–5.1)
Sodium: 139 meq/L (ref 135–145)
Total Bilirubin: 0.4 mg/dL (ref 0.2–1.2)
Total Protein: 7.3 g/dL (ref 6.0–8.3)

## 2022-11-04 LAB — MICROALBUMIN / CREATININE URINE RATIO
Creatinine,U: 70.9 mg/dL
Microalb Creat Ratio: 2.3 mg/g (ref 0.0–30.0)
Microalb, Ur: 1.6 mg/dL (ref 0.0–1.9)

## 2022-11-04 LAB — LIPID PANEL
Cholesterol: 264 mg/dL — ABNORMAL HIGH (ref 0–200)
HDL: 99.6 mg/dL (ref >39.00)
LDL Cholesterol: 147 mg/dL — ABNORMAL HIGH (ref 0–99)
NonHDL: 164.3
Total CHOL/HDL Ratio: 3
Triglycerides: 86 mg/dL (ref 0.0–149.0)
VLDL: 17.2 mg/dL (ref 0.0–40.0)

## 2022-11-04 LAB — LDL CHOLESTEROL, DIRECT: Direct LDL: 140 mg/dL

## 2022-11-04 LAB — TSH: TSH: 1.61 u[IU]/mL (ref 0.35–5.50)

## 2022-11-04 LAB — HEMOGLOBIN A1C: Hgb A1c MFr Bld: 5.4 % (ref 4.6–6.5)

## 2022-11-04 MED ORDER — HYDROCHLOROTHIAZIDE 25 MG PO TABS
25.0000 mg | ORAL_TABLET | Freq: Every day | ORAL | 3 refills | Status: DC
  Filled 2022-11-04 – 2022-12-14 (×2): qty 90, 90d supply, fill #0
  Filled 2023-03-15: qty 90, 90d supply, fill #1
  Filled 2023-06-13: qty 90, 90d supply, fill #2
  Filled 2023-09-11: qty 90, 90d supply, fill #3

## 2022-11-04 NOTE — Assessment & Plan Note (Signed)
Now bilaterally,  receiving injections to avoid surgery

## 2022-11-04 NOTE — Assessment & Plan Note (Addendum)
She has declined therapy and has improved her T scores from -2.5 to -2.2 without intervention  Continue weight bearing exercise  and dietary sources of calcium

## 2022-11-04 NOTE — Progress Notes (Signed)
Patient ID: Karen Odom, female    DOB: Sep 06, 1955  Age: 67 y.o. MRN: [id number]  The patient is here for annual preventive examination and management of other chronic and acute problems.   The risk factors are reflected in the social history.   The roster of all physicians providing medical care to patient - is listed in the Snapshot section of the chart.   Activities of daily living:  The patient is 100% independent in all ADLs: dressing, toileting, feeding as well as independent mobility   Home safety : The patient has smoke detectors in the home. They wear seatbelts.  There are no unsecured firearms at home. There is no violence in the home.    There is no risks for hepatitis, STDs or HIV. There is no   history of blood transfusion. They have no travel history to infectious disease endemic areas of the world.   The patient has seen their dentist in the last six month. They have seen their eye doctor in the last year. The patinet  denies slight hearing difficulty with regard to whispered voices and some television programs.  They have deferred audiologic testing in the last year.  They do not  have excessive sun exposure. Discussed the need for sun protection: hats, long sleeves and use of sunscreen if there is significant sun exposure.    Diet: the importance of a healthy diet is discussed. They do have a healthy diet.   The benefits of regular aerobic exercise were discussed. The patient  exercises  3 to 5 days per week  for  60 minutes.    Depression screen: there are no signs or vegative symptoms of depression- irritability, change in appetite, anhedonia, sadness/tearfullness.   The following portions of the patient's history were reviewed and updated as appropriate: allergies, current medications, past family history, past medical history,  past surgical history, past social history  and problem list.   Visual acuity was not assessed per patient preference since the patient has  regular follow up with an  ophthalmologist. Hearing and body mass index were assessed and reviewed.    During the course of the visit the patient was educated and counseled about appropriate screening and preventive services including : fall prevention , diabetes screening, nutrition counseling, colorectal cancer screening, and recommended immunizations.    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: numbness  not pain  managed naturally by [facility] with home therapies including shock wave therapy and laser      Review of Symptoms  Patient denies headache, fevers, malaise, unintentional weight loss, skin rash, eye pain, sinus congestion and sinus pain, sore throat, dysphagia,  hemoptysis , cough, dyspnea, wheezing, chest pain, palpitations, orthopnea, edema, abdominal pain, nausea, melena, diarrhea, constipation, flank pain, dysuria, hematuria, urinary  Frequency, nocturia, numbness, tingling, seizures,  Focal weakness, Loss of consciousness,  Tremor, insomnia, depression, anxiety, and suicidal ideation.    Physical Exam:  BP 116/68   Pulse 70   Ht 5\' 6"  (1.676 m)   Wt 139 lb 12.8 oz (63.4 kg)   SpO2 99%   BMI 22.56 kg/m    Physical Exam Vitals reviewed.  Constitutional:      General: She is not in acute distress.    Appearance: Normal appearance. She is well-developed and normal weight. She is not ill-appearing, toxic-appearing or diaphoretic.  HENT:     Head: Normocephalic.     Right Ear: Tympanic membrane, ear canal and  external ear normal. There is no impacted cerumen.     Left Ear: Tympanic membrane, ear canal and external ear normal. There is no impacted cerumen.     Nose: Nose normal.     Mouth/Throat:     Mouth: Mucous membranes are moist.     Pharynx: Oropharynx is clear.  Eyes:     General: No scleral icterus.       Right eye: No discharge.        Left eye: No discharge.     Conjunctiva/sclera: Conjunctivae normal.     Pupils:  Pupils are equal, round, and reactive to light.  Neck:     Thyroid: No thyromegaly.     Vascular: No carotid bruit or JVD.  Cardiovascular:     Rate and Rhythm: Normal rate and regular rhythm.     Heart sounds: Normal heart sounds.  Pulmonary:     Effort: Pulmonary effort is normal. No respiratory distress.     Breath sounds: Normal breath sounds.  Chest:  Breasts:    Breasts are symmetrical.     Right: Normal. No swelling, inverted nipple, mass, nipple discharge, skin change or tenderness.     Left: Normal. No swelling, inverted nipple, mass, nipple discharge, skin change or tenderness.  Abdominal:     General: Bowel sounds are normal.     Palpations: Abdomen is soft. There is no mass.     Tenderness: There is no abdominal tenderness. There is no guarding or rebound.  Musculoskeletal:        General: Normal range of motion.     Cervical back: Normal range of motion and neck supple.  Lymphadenopathy:     Cervical: No cervical adenopathy.     Upper Body:     Right upper body: No supraclavicular, axillary or pectoral adenopathy.     Left upper body: No supraclavicular, axillary or pectoral adenopathy.  Skin:    General: Skin is warm and dry.  Neurological:     General: No focal deficit present.     Mental Status: She is alert and oriented to person, place, and time. Mental status is at baseline.  Psychiatric:        Mood and Affect: Mood normal.        Behavior: Behavior normal.        Thought Content: Thought content normal.        Judgment: Judgment normal.   Assessment and Plan: Essential hypertension, benign  Visit for preventive health examination  Dyslipidemia  Impaired fasting glucose  Other fatigue  Colon cancer screening    No follow-ups on file.  Sherlene Shams, MD

## 2022-11-06 NOTE — Assessment & Plan Note (Signed)
Managed with telmisartan with minimal microalbunuria. No changes today  Lab Results  Component Value Date   MICROALBUR 1.6 11/04/2022   MICROALBUR 2.4 (H) 10/22/2021     Lab Results  Component Value Date   CREATININE 0.73 11/04/2022   Lab Results  Component Value Date   NA 139 11/04/2022   K 4.3 11/04/2022   CL 102 11/04/2022   CO2 27 11/04/2022

## 2022-11-06 NOTE — Assessment & Plan Note (Signed)

## 2022-11-07 NOTE — Addendum Note (Signed)
Addended by: Sherlene Shams on: 11/07/2022 10:23 PM   Modules accepted: Orders

## 2022-11-11 ENCOUNTER — Encounter: Payer: Self-pay | Admitting: *Deleted

## 2022-12-14 ENCOUNTER — Other Ambulatory Visit (INDEPENDENT_AMBULATORY_CARE_PROVIDER_SITE_OTHER): Payer: Commercial Managed Care - PPO

## 2022-12-14 ENCOUNTER — Other Ambulatory Visit: Payer: Self-pay

## 2022-12-14 DIAGNOSIS — D75839 Thrombocytosis, unspecified: Secondary | ICD-10-CM

## 2022-12-14 LAB — CBC WITH DIFFERENTIAL/PLATELET
Basophils Absolute: 0.1 10*3/uL (ref 0.0–0.1)
Basophils Relative: 1.8 % (ref 0.0–3.0)
Eosinophils Absolute: 0.1 10*3/uL (ref 0.0–0.7)
Eosinophils Relative: 2.6 % (ref 0.0–5.0)
HCT: 40.7 % (ref 36.0–46.0)
Hemoglobin: 13.5 g/dL (ref 12.0–15.0)
Lymphocytes Relative: 25.9 % (ref 12.0–46.0)
Lymphs Abs: 1.3 10*3/uL (ref 0.7–4.0)
MCHC: 33.2 g/dL (ref 30.0–36.0)
MCV: 101.4 fL — ABNORMAL HIGH (ref 78.0–100.0)
Monocytes Absolute: 0.7 10*3/uL (ref 0.1–1.0)
Monocytes Relative: 13.7 % — ABNORMAL HIGH (ref 3.0–12.0)
Neutro Abs: 2.7 10*3/uL (ref 1.4–7.7)
Neutrophils Relative %: 56 % (ref 43.0–77.0)
Platelets: 411 10*3/uL — ABNORMAL HIGH (ref 150.0–400.0)
RBC: 4.01 Mil/uL (ref 3.87–5.11)
RDW: 13.6 % (ref 11.5–15.5)
WBC: 4.9 10*3/uL (ref 4.0–10.5)

## 2022-12-18 NOTE — Addendum Note (Signed)
Addended by: Sherlene Shams on: 12/18/2022 01:06 PM   Modules accepted: Orders

## 2022-12-23 DIAGNOSIS — H5203 Hypermetropia, bilateral: Secondary | ICD-10-CM | POA: Diagnosis not present

## 2022-12-23 DIAGNOSIS — H25013 Cortical age-related cataract, bilateral: Secondary | ICD-10-CM | POA: Diagnosis not present

## 2022-12-23 DIAGNOSIS — H524 Presbyopia: Secondary | ICD-10-CM | POA: Diagnosis not present

## 2023-03-13 ENCOUNTER — Other Ambulatory Visit: Payer: Self-pay | Admitting: Internal Medicine

## 2023-03-14 ENCOUNTER — Other Ambulatory Visit: Payer: Self-pay

## 2023-03-14 MED ORDER — TELMISARTAN 80 MG PO TABS
80.0000 mg | ORAL_TABLET | Freq: Every day | ORAL | 1 refills | Status: DC
  Filled 2023-03-14: qty 90, 90d supply, fill #0
  Filled 2023-06-13: qty 90, 90d supply, fill #1

## 2023-05-05 ENCOUNTER — Other Ambulatory Visit: Payer: Self-pay | Admitting: Internal Medicine

## 2023-05-05 DIAGNOSIS — Z1231 Encounter for screening mammogram for malignant neoplasm of breast: Secondary | ICD-10-CM

## 2023-08-23 DIAGNOSIS — Z86018 Personal history of other benign neoplasm: Secondary | ICD-10-CM | POA: Diagnosis not present

## 2023-08-23 DIAGNOSIS — L578 Other skin changes due to chronic exposure to nonionizing radiation: Secondary | ICD-10-CM | POA: Diagnosis not present

## 2023-08-23 DIAGNOSIS — L851 Acquired keratosis [keratoderma] palmaris et plantaris: Secondary | ICD-10-CM | POA: Diagnosis not present

## 2023-09-09 ENCOUNTER — Other Ambulatory Visit: Payer: Self-pay

## 2023-09-09 ENCOUNTER — Other Ambulatory Visit: Payer: Self-pay | Admitting: Internal Medicine

## 2023-09-12 ENCOUNTER — Other Ambulatory Visit: Payer: Self-pay

## 2023-09-12 MED ORDER — TELMISARTAN 80 MG PO TABS
80.0000 mg | ORAL_TABLET | Freq: Every day | ORAL | 0 refills | Status: DC
  Filled 2023-09-12: qty 90, 90d supply, fill #0

## 2023-09-29 ENCOUNTER — Ambulatory Visit
Admission: RE | Admit: 2023-09-29 | Discharge: 2023-09-29 | Disposition: A | Discharge status: Home / Self Care | Source: Ambulatory Visit | Attending: Internal Medicine | Admitting: Internal Medicine

## 2023-09-29 DIAGNOSIS — Z1231 Encounter for screening mammogram for malignant neoplasm of breast: Secondary | ICD-10-CM | POA: Diagnosis not present

## 2023-10-03 ENCOUNTER — Other Ambulatory Visit: Payer: Self-pay | Admitting: Internal Medicine

## 2023-10-03 DIAGNOSIS — R928 Other abnormal and inconclusive findings on diagnostic imaging of breast: Secondary | ICD-10-CM

## 2023-10-03 DIAGNOSIS — N6489 Other specified disorders of breast: Secondary | ICD-10-CM

## 2023-10-05 ENCOUNTER — Ambulatory Visit: Payer: Self-pay | Admitting: Internal Medicine

## 2023-10-10 ENCOUNTER — Encounter

## 2023-10-10 ENCOUNTER — Inpatient Hospital Stay: Admission: RE | Admit: 2023-10-10 | Source: Ambulatory Visit

## 2023-10-14 ENCOUNTER — Ambulatory Visit
Admission: RE | Admit: 2023-10-14 | Discharge: 2023-10-14 | Disposition: A | Discharge status: Home / Self Care | Source: Ambulatory Visit | Attending: Internal Medicine | Admitting: Internal Medicine

## 2023-10-14 DIAGNOSIS — N6489 Other specified disorders of breast: Secondary | ICD-10-CM | POA: Insufficient documentation

## 2023-10-14 DIAGNOSIS — R92333 Mammographic heterogeneous density, bilateral breasts: Secondary | ICD-10-CM | POA: Diagnosis not present

## 2023-10-14 DIAGNOSIS — R928 Other abnormal and inconclusive findings on diagnostic imaging of breast: Secondary | ICD-10-CM | POA: Insufficient documentation

## 2023-10-14 DIAGNOSIS — N6325 Unspecified lump in the left breast, overlapping quadrants: Secondary | ICD-10-CM | POA: Diagnosis not present

## 2023-10-14 DIAGNOSIS — D242 Benign neoplasm of left breast: Secondary | ICD-10-CM | POA: Diagnosis not present

## 2023-11-10 DIAGNOSIS — M720 Palmar fascial fibromatosis [Dupuytren]: Secondary | ICD-10-CM | POA: Diagnosis not present

## 2023-11-14 ENCOUNTER — Ambulatory Visit: Admitting: Internal Medicine

## 2023-11-14 ENCOUNTER — Encounter: Payer: Self-pay | Admitting: Internal Medicine

## 2023-11-14 ENCOUNTER — Other Ambulatory Visit: Payer: Self-pay

## 2023-11-14 VITALS — BP 112/70 | HR 68 | Ht 66.0 in | Wt 140.8 lb

## 2023-11-14 DIAGNOSIS — I10 Essential (primary) hypertension: Secondary | ICD-10-CM

## 2023-11-14 DIAGNOSIS — E785 Hyperlipidemia, unspecified: Secondary | ICD-10-CM | POA: Diagnosis not present

## 2023-11-14 DIAGNOSIS — Z0000 Encounter for general adult medical examination without abnormal findings: Secondary | ICD-10-CM

## 2023-11-14 DIAGNOSIS — R7301 Impaired fasting glucose: Secondary | ICD-10-CM

## 2023-11-14 DIAGNOSIS — D75839 Thrombocytosis, unspecified: Secondary | ICD-10-CM

## 2023-11-14 DIAGNOSIS — D122 Benign neoplasm of ascending colon: Secondary | ICD-10-CM | POA: Diagnosis not present

## 2023-11-14 DIAGNOSIS — R5383 Other fatigue: Secondary | ICD-10-CM | POA: Diagnosis not present

## 2023-11-14 DIAGNOSIS — Z124 Encounter for screening for malignant neoplasm of cervix: Secondary | ICD-10-CM | POA: Diagnosis not present

## 2023-11-14 DIAGNOSIS — G620 Drug-induced polyneuropathy: Secondary | ICD-10-CM

## 2023-11-14 MED ORDER — HYDROCHLOROTHIAZIDE 25 MG PO TABS
25.0000 mg | ORAL_TABLET | Freq: Every day | ORAL | 3 refills | Status: AC
  Filled 2023-11-14 – 2023-12-08 (×2): qty 90, 90d supply, fill #0

## 2023-11-14 MED ORDER — TELMISARTAN 80 MG PO TABS
80.0000 mg | ORAL_TABLET | Freq: Every day | ORAL | 0 refills | Status: AC
  Filled 2023-11-14 – 2023-12-08 (×2): qty 90, 90d supply, fill #0

## 2023-11-14 NOTE — Progress Notes (Unsigned)
 Patient ID: Karen Odom, female    DOB: 08-15-1955  Age: 68 y.o. MRN: [id number]  The patient is here for annual preventive examination and management of other chronic and acute problems.   The risk factors are reflected in the social history.   The roster of all physicians providing medical care to patient - is listed in the Snapshot section of the chart.   Activities of daily living:  The patient is 100% independent in all ADLs: dressing, toileting, feeding as well as independent mobility   Home safety : The patient has smoke detectors in the home. They wear seatbelts.  There are no unsecured firearms at home. There is no violence in the home.    There is no risks for hepatitis, STDs or HIV. There is no   history of blood transfusion. They have no travel history to infectious disease endemic areas of the world.   The patient has seen their dentist in the last six month. They have seen their eye doctor in the last year. The patinet  denies slight hearing difficulty with regard to whispered voices and some television programs.  They have deferred audiologic testing in the last year.  They do not  have excessive sun exposure. Discussed the need for sun protection: hats, long sleeves and use of sunscreen if there is significant sun exposure.    Diet: the importance of a healthy diet is discussed. They do have a healthy diet.   The benefits of regular aerobic exercise were discussed. The patient  exercises  3 to 5 days per week  for  60 minutes.    Depression screen: there are no signs or vegative symptoms of depression- irritability, change in appetite, anhedonia, sadness/tearfullness.   The following portions of the patient's history were reviewed and updated as appropriate: allergies, current medications, past family history, past medical history,  past surgical history, past social history  and problem list.   Visual acuity was not assessed per patient preference since the patient has  regular follow up with an  ophthalmologist. Hearing and body mass index were assessed and reviewed.    During the course of the visit the patient was educated and counseled about appropriate screening and preventive services including : fall prevention , diabetes screening, nutrition counseling, colorectal cancer screening, and recommended immunizations.    Chief Complaint:  Hypertension: patient checks blood pressure twice weekly at home.  Readings have been for the most part <130/80 at rest . Patient is following a reduced salt diet most days and is taking medications as prescribed      Review of Symptoms  Patient denies headache, fevers, malaise, unintentional weight loss, skin rash, eye pain, sinus congestion and sinus pain, sore throat, dysphagia,  hemoptysis , cough, dyspnea, wheezing, chest pain, palpitations, orthopnea, edema, abdominal pain, nausea, melena, diarrhea, constipation, flank pain, dysuria, hematuria, urinary  Frequency, nocturia, numbness, tingling, seizures,  Focal weakness, Loss of consciousness,  Tremor, insomnia, depression, anxiety, and suicidal ideation.    Physical Exam:  BP 112/70   Pulse 68   Ht 5' 6 (1.676 m)   Wt 140 lb 12.8 oz (63.9 kg)   SpO2 95%   BMI 22.73 kg/m    Physical Exam Vitals reviewed.  Constitutional:      General: She is not in acute distress.    Appearance: Normal appearance. She is normal weight. She is not ill-appearing, toxic-appearing or diaphoretic.  HENT:     Head: Normocephalic.  Eyes:  General: No scleral icterus.       Right eye: No discharge.        Left eye: No discharge.     Conjunctiva/sclera: Conjunctivae normal.  Cardiovascular:     Rate and Rhythm: Normal rate and regular rhythm.     Heart sounds: Normal heart sounds.  Pulmonary:     Effort: Pulmonary effort is normal. No respiratory distress.     Breath sounds: Normal breath sounds.  Musculoskeletal:        General: Normal range of motion.  Skin:     General: Skin is warm and dry.  Neurological:     General: No focal deficit present.     Mental Status: She is alert and oriented to person, place, and time. Mental status is at baseline.  Psychiatric:        Mood and Affect: Mood normal.        Behavior: Behavior normal.        Thought Content: Thought content normal.        Judgment: Judgment normal.     Assessment and Plan: Benign neoplasm of ascending colon -     Ambulatory referral to Gastroenterology  Other fatigue -     TSH -     CBC with Differential/Platelet  Dyslipidemia -     LDL cholesterol, direct -     Lipid panel  Impaired fasting glucose -     Hemoglobin A1c -     Comprehensive metabolic panel with GFR  Essential hypertension, benign Assessment & Plan: Managed with telmisartan  with minimal microalbunuria. No changes today .  Las are overdue  No results found for: CORDIE [place]    Lab Results  Component Value Date   CREATININE 0.73 11/04/2022   Lab Results  Component Value Date   NA 139 11/04/2022   K 4.3 11/04/2022   CL 102 11/04/2022   CO2 27 11/04/2022     Orders: -     Comprehensive metabolic panel with GFR -     Microalbumin / creatinine urine ratio  Thrombocytosis Assessment & Plan: Chronic and mild,  did not return for workup last year.  Serologies ordered   Orders: -     IBC + Ferritin -     CBC with Differential/Platelet  Neuropathy due to drug Assessment & Plan: Attributed to COVID vaccine , Treated by Dr Fonda Forget at [facility] with red laser light bid ; no change in symptoms  yet despite 1 year of therapy    Visit for preventive health examination Assessment & Plan: age appropriate education and counseling updated, referrals for preventative services and immunizations addressed, dietary and smoking counseling addressed, most recent labs reviewed.  I have personally reviewed and have noted:   1) the patient's medical and social history 2) The  pt's use of alcohol, tobacco, and illicit drugs 3) The patient's current medications and supplements 4) Functional ability including ADL's, fall risk, home safety risk, hearing and visual impairment 5) Diet and physical activities 6) Evidence for depression or mood disorder 7) The patient's height, weight, and BMI have been recorded in the chart     I have made referrals, and provided counseling and education based on review of the above    Screening for cervical cancer Assessment & Plan: PAP smears have been normal, HPV negative for 10 years.  No further surveillance needed    Other orders -     hydroCHLOROthiazide ; Take 1 tablet (25 mg total) by  mouth daily.  Dispense: 90 tablet; Refill: 3 -     Telmisartan ; Take 1 tablet (80 mg total) by mouth at bedtime.  Dispense: 90 tablet; Refill: 0    Return in about 6 months (around 05/14/2024) for hypertension.  Verneita LITTIE Kettering, MD

## 2023-11-14 NOTE — Assessment & Plan Note (Signed)
 Attributed to COVID vaccine , Treated by Dr Fonda Forget at [facility] with red laser light bid ; no change in symptoms  yet despite 1 year of therapy

## 2023-11-15 ENCOUNTER — Telehealth: Payer: Self-pay

## 2023-11-15 LAB — CBC WITH DIFFERENTIAL/PLATELET
Basophils Absolute: 0 K/uL (ref 0.0–0.1)
Basophils Relative: 0.8 % (ref 0.0–3.0)
Eosinophils Absolute: 0.1 K/uL (ref 0.0–0.7)
Eosinophils Relative: 2.1 % (ref 0.0–5.0)
HCT: 41.4 % (ref 36.0–46.0)
Hemoglobin: 14 g/dL (ref 12.0–15.0)
Lymphocytes Relative: 36.2 % (ref 12.0–46.0)
Lymphs Abs: 2.1 K/uL (ref 0.7–4.0)
MCHC: 33.8 g/dL (ref 30.0–36.0)
MCV: 99.5 fl (ref 78.0–100.0)
Monocytes Absolute: 0.7 K/uL (ref 0.1–1.0)
Monocytes Relative: 11.9 % (ref 3.0–12.0)
Neutro Abs: 2.8 K/uL (ref 1.4–7.7)
Neutrophils Relative %: 49 % (ref 43.0–77.0)
Platelets: 428 K/uL — ABNORMAL HIGH (ref 150.0–400.0)
RBC: 4.16 Mil/uL (ref 3.87–5.11)
RDW: 13.4 % (ref 11.5–15.5)
WBC: 5.8 K/uL (ref 4.0–10.5)

## 2023-11-15 LAB — COMPREHENSIVE METABOLIC PANEL WITH GFR
ALT: 22 U/L (ref 0–35)
AST: 21 U/L (ref 0–37)
Albumin: 4.7 g/dL (ref 3.5–5.2)
Alkaline Phosphatase: 46 U/L (ref 39–117)
BUN: 20 mg/dL (ref 6–23)
CO2: 30 meq/L (ref 19–32)
Calcium: 10.3 mg/dL (ref 8.4–10.5)
Chloride: 95 meq/L — ABNORMAL LOW (ref 96–112)
Creatinine, Ser: 0.64 mg/dL (ref 0.40–1.20)
GFR: 91.37 mL/min (ref >60.00)
Glucose, Bld: 89 mg/dL (ref 70–99)
Potassium: 3.5 meq/L (ref 3.5–5.1)
Sodium: 136 meq/L (ref 135–145)
Total Bilirubin: 0.8 mg/dL (ref 0.2–1.2)
Total Protein: 7.3 g/dL (ref 6.0–8.3)

## 2023-11-15 LAB — IBC + FERRITIN
Ferritin: 33.5 ng/mL (ref 10.0–291.0)
Iron: 161 ug/dL — ABNORMAL HIGH (ref 42–145)
Saturation Ratios: 43.2 % (ref 20.0–50.0)
TIBC: 372.4 ug/dL (ref 250.0–450.0)
Transferrin: 266 mg/dL (ref 212.0–360.0)

## 2023-11-15 LAB — LIPID PANEL
Cholesterol: 287 mg/dL — ABNORMAL HIGH (ref 0–200)
HDL: 108.3 mg/dL (ref >39.00)
LDL Cholesterol: 164 mg/dL — ABNORMAL HIGH (ref 0–99)
NonHDL: 178.25
Total CHOL/HDL Ratio: 3
Triglycerides: 71 mg/dL (ref 0.0–149.0)
VLDL: 14.2 mg/dL (ref 0.0–40.0)

## 2023-11-15 LAB — LDL CHOLESTEROL, DIRECT: Direct LDL: 151 mg/dL

## 2023-11-15 LAB — MICROALBUMIN / CREATININE URINE RATIO
Creatinine,U: 97.2 mg/dL
Microalb Creat Ratio: 14.8 mg/g (ref 0.0–30.0)
Microalb, Ur: 1.4 mg/dL (ref 0.0–1.9)

## 2023-11-15 LAB — HEMOGLOBIN A1C: Hgb A1c MFr Bld: 5.6 % (ref 4.6–6.5)

## 2023-11-15 LAB — TSH: TSH: 2.92 u[IU]/mL (ref 0.35–5.50)

## 2023-11-15 NOTE — Telephone Encounter (Signed)
 Spoke with pt and explained to her that Dr. Marylynn likes to see all her pt's that are being treated for hypertension every 6 months to make sure the bp is staying controlled. Pt gave a verbal understanding.

## 2023-11-15 NOTE — Assessment & Plan Note (Signed)
 Chronic and mild,  did not return for workup last year.  Serologies ordered

## 2023-11-15 NOTE — Assessment & Plan Note (Signed)
 Managed with telmisartan  with minimal microalbunuria. No changes today .  Las are overdue  No results found for: C[place]IE [place]    Lab Results  Component Value Date   CREATININE 0.73 11/04/2022   Lab Results  Component Value Date   NA 139 11/04/2022   K 4.3 11/04/2022   CL 102 11/04/2022   CO2 27 11/04/2022

## 2023-11-15 NOTE — Assessment & Plan Note (Signed)
 PAP smears have been normal, HPV negative for 10 years.  No further surveillance needed

## 2023-11-15 NOTE — Telephone Encounter (Signed)
 Copied from CRM #[id number]. Topic: Appointments - Scheduling Inquiry for Clinic >> Nov 15, 2023 11:09 AM Laymon HERO wrote: Reason for CRM: Patient was seen on 10/27 and there is confusion as to why it said a follow up for hypertension- please contact patient as soon as possible.

## 2023-11-15 NOTE — Assessment & Plan Note (Signed)

## 2023-11-17 ENCOUNTER — Ambulatory Visit: Payer: Self-pay | Admitting: Internal Medicine

## 2023-11-21 ENCOUNTER — Other Ambulatory Visit: Payer: Self-pay | Admitting: Internal Medicine

## 2023-11-21 DIAGNOSIS — R928 Other abnormal and inconclusive findings on diagnostic imaging of breast: Secondary | ICD-10-CM

## 2023-11-24 ENCOUNTER — Ambulatory Visit
Admission: RE | Admit: 2023-11-24 | Discharge: 2023-11-24 | Disposition: A | Discharge status: Home / Self Care | Source: Ambulatory Visit | Attending: Internal Medicine | Admitting: Internal Medicine

## 2023-11-24 DIAGNOSIS — R928 Other abnormal and inconclusive findings on diagnostic imaging of breast: Secondary | ICD-10-CM | POA: Diagnosis present

## 2023-11-24 DIAGNOSIS — D242 Benign neoplasm of left breast: Secondary | ICD-10-CM | POA: Diagnosis not present

## 2023-11-24 DIAGNOSIS — N6325 Unspecified lump in the left breast, overlapping quadrants: Secondary | ICD-10-CM | POA: Diagnosis not present

## 2023-11-24 HISTORY — PX: BREAST BIOPSY: SHX20

## 2023-11-24 MED ORDER — LIDOCAINE-EPINEPHRINE 1 %-1:100000 IJ SOLN
5.0000 mL | Freq: Once | INTRAMUSCULAR | Status: AC
  Administered 2023-11-24: 5 mL
  Filled 2023-11-24: qty 5

## 2023-11-24 MED ORDER — LIDOCAINE 1 % OPTIME INJ - NO CHARGE
2.0000 mL | Freq: Once | INTRAMUSCULAR | Status: AC
  Administered 2023-11-24: 2 mL
  Filled 2023-11-24: qty 2

## 2023-11-25 LAB — SURGICAL PATHOLOGY

## 2023-11-27 ENCOUNTER — Ambulatory Visit: Payer: Self-pay | Admitting: Internal Medicine

## 2023-12-08 ENCOUNTER — Other Ambulatory Visit: Payer: Self-pay

## 2023-12-27 DIAGNOSIS — H5203 Hypermetropia, bilateral: Secondary | ICD-10-CM | POA: Diagnosis not present

## 2023-12-27 DIAGNOSIS — H524 Presbyopia: Secondary | ICD-10-CM | POA: Diagnosis not present

## 2024-01-02 ENCOUNTER — Other Ambulatory Visit: Payer: Self-pay

## 2024-01-24 ENCOUNTER — Other Ambulatory Visit: Payer: Self-pay

## 2024-01-26 ENCOUNTER — Other Ambulatory Visit: Payer: Self-pay

## 2024-01-26 ENCOUNTER — Other Ambulatory Visit (HOSPITAL_COMMUNITY): Payer: Self-pay

## 2024-01-26 MED ORDER — PENICILLIN V POTASSIUM 500 MG PO TABS
ORAL_TABLET | ORAL | 0 refills | Status: AC
  Filled 2024-01-26: qty 28, 7d supply, fill #0

## 2024-02-07 ENCOUNTER — Other Ambulatory Visit: Payer: Self-pay

## 2024-02-07 MED ORDER — AMOXICILLIN 500 MG PO CAPS
500.0000 mg | ORAL_CAPSULE | Freq: Three times a day (TID) | ORAL | 0 refills | Status: AC
  Filled 2024-02-07: qty 21, 7d supply, fill #0

## 2024-05-17 ENCOUNTER — Ambulatory Visit: Admitting: Internal Medicine
